# Patient Record
Sex: Male | Born: 1980 | Race: Black or African American | Hispanic: No | Smoking: Former smoker
Health system: Southern US, Community
[De-identification: ages and names within clinical notes are randomized; demographics above are authoritative.]

---

## 2004-03-11 ENCOUNTER — Emergency Department (HOSPITAL_COMMUNITY): Admission: EM | Admit: 2004-03-11 | Discharge: 2004-03-11 | Payer: Self-pay | Admitting: Emergency Medicine

## 2004-03-12 ENCOUNTER — Emergency Department (HOSPITAL_COMMUNITY): Admission: EM | Admit: 2004-03-12 | Discharge: 2004-03-12 | Payer: Self-pay | Admitting: Emergency Medicine

## 2007-01-11 ENCOUNTER — Emergency Department: Payer: Self-pay | Admitting: Emergency Medicine

## 2008-07-31 ENCOUNTER — Emergency Department (HOSPITAL_COMMUNITY): Admission: EM | Admit: 2008-07-31 | Discharge: 2008-07-31 | Payer: Self-pay | Admitting: Emergency Medicine

## 2012-08-22 ENCOUNTER — Encounter (HOSPITAL_COMMUNITY): Payer: Self-pay | Admitting: Emergency Medicine

## 2012-08-22 ENCOUNTER — Emergency Department (HOSPITAL_COMMUNITY): Payer: BC Managed Care – PPO

## 2012-08-22 ENCOUNTER — Emergency Department (HOSPITAL_COMMUNITY)
Admission: EM | Admit: 2012-08-22 | Discharge: 2012-08-22 | Disposition: A | Discharge status: Home / Self Care | Payer: BC Managed Care – PPO | Attending: Emergency Medicine | Admitting: Emergency Medicine

## 2012-08-22 DIAGNOSIS — IMO0002 Reserved for concepts with insufficient information to code with codable children: Secondary | ICD-10-CM | POA: Insufficient documentation

## 2012-08-22 DIAGNOSIS — T1490XA Injury, unspecified, initial encounter: Secondary | ICD-10-CM

## 2012-08-22 DIAGNOSIS — Z79899 Other long term (current) drug therapy: Secondary | ICD-10-CM | POA: Insufficient documentation

## 2012-08-22 DIAGNOSIS — F17200 Nicotine dependence, unspecified, uncomplicated: Secondary | ICD-10-CM | POA: Insufficient documentation

## 2012-08-22 DIAGNOSIS — S6990XA Unspecified injury of unspecified wrist, hand and finger(s), initial encounter: Secondary | ICD-10-CM | POA: Insufficient documentation

## 2012-08-22 DIAGNOSIS — R209 Unspecified disturbances of skin sensation: Secondary | ICD-10-CM | POA: Insufficient documentation

## 2012-08-22 DIAGNOSIS — S59919A Unspecified injury of unspecified forearm, initial encounter: Secondary | ICD-10-CM | POA: Insufficient documentation

## 2012-08-22 DIAGNOSIS — S59909A Unspecified injury of unspecified elbow, initial encounter: Secondary | ICD-10-CM | POA: Insufficient documentation

## 2012-08-22 DIAGNOSIS — M79641 Pain in right hand: Secondary | ICD-10-CM

## 2012-08-22 DIAGNOSIS — Y9389 Activity, other specified: Secondary | ICD-10-CM | POA: Insufficient documentation

## 2012-08-22 DIAGNOSIS — Y9289 Other specified places as the place of occurrence of the external cause: Secondary | ICD-10-CM | POA: Insufficient documentation

## 2012-08-22 DIAGNOSIS — M25531 Pain in right wrist: Secondary | ICD-10-CM

## 2012-08-22 DIAGNOSIS — Y990 Civilian activity done for income or pay: Secondary | ICD-10-CM | POA: Insufficient documentation

## 2012-08-22 MED ORDER — IBUPROFEN 800 MG PO TABS: 800.0000 mg | ORAL_TABLET | Freq: Three times a day (TID) | ORAL | Status: DC

## 2012-08-22 NOTE — Progress Notes (Signed)
Orthopedic Tech Progress Note Patient Details:  Shane Lopez 05/08/1981 [id number]  Ortho Devices Type of Ortho Device: Velcro wrist forearm splint Ortho Device/Splint Interventions: Application   Cammer, Mickie Bail 08/22/2012, 10:38 AM

## 2012-08-22 NOTE — ED Provider Notes (Signed)
History     CSN: [id number]  Arrival date & time 08/22/12  3086   First MD Initiated Contact with Patient 08/22/12 [phone]      Chief Complaint  Patient presents with  . Wrist Pain  . Hand Pain    (Consider location/radiation/quality/duration/timing/severity/associated sxs/prior treatment) HPI Comments: Patient is a 32 y/o M c/o right hand and wrist pain x 1 day. Patient reported that while throwing cardboard boxes into the compactor he hit his right hand on a steel bar. Patient reported pain to the right hand with radiation to right lateral aspect of forearm, does not radiate to the elbow - pain described as a dull constant pain that has gotten progressively worse since this morning, with intermittent episodes of pulsating discomfort to right hand and digits. Patient reported mild numbness and tingling to right hand and digits. Patient reported taking Ibuprofen last night and this morning with mild relief, pain worsens with motion and making a fist. Denied fever, chills, chest pain, shortness of breathe, difficulty breathing, visual disturbances, headache, head trauma.   Patient reported previous injury to right hand back in 2002 while playing football.   Patient is a 32 y.o. male presenting with wrist pain and hand pain. The history is provided by the patient. No language interpreter was used.  Wrist Pain This is a new problem. The current episode started yesterday. The problem occurs constantly. The problem has been gradually worsening. Pertinent negatives include no abdominal pain, chest pain, fever, headaches or neck pain. Exacerbated by: Motion to rigth hand and wrist. He has tried NSAIDs for the symptoms. The treatment provided no relief.  Hand Pain This is a new problem. The current episode started yesterday. The problem occurs constantly. The problem has been gradually worsening. Pertinent negatives include no abdominal pain, chest pain, fever, headaches or neck pain. Associated  symptoms comments: Mild parethesias to right digits. Exacerbated by: Motion to right hand and wrist. He has tried NSAIDs for the symptoms. The treatment provided no relief.    History reviewed. No pertinent past medical history.  History reviewed. No pertinent past surgical history.  No family history on file.  History  Substance Use Topics  . Smoking status: Current Every Day Smoker  . Smokeless tobacco: Not on file  . Alcohol Use: No      Review of Systems  Constitutional: Negative for fever.  HENT: Negative for neck pain.   Respiratory: Negative for chest tightness and shortness of breath.   Cardiovascular: Negative for chest pain.  Gastrointestinal: Negative for abdominal pain.  Musculoskeletal:       Right hand and wrist pain.   Neurological: Negative for dizziness and headaches.  10 Systems reviewed and are negative for acute change except as noted in the HPI.   Allergies  Review of patient's allergies indicates no known allergies.  Home Medications   Current Outpatient Rx  Name  Route  Sig  Dispense  Refill  . Multiple Vitamin (MULTIVITAMIN WITH MINERALS) TABS   Oral   Take 1 tablet by mouth daily.         . vitamin C (ASCORBIC ACID) 500 MG tablet   Oral   Take 500 mg by mouth daily.         Marland Kitchen ibuprofen (ADVIL,MOTRIN) 800 MG tablet   Oral   Take 1 tablet (800 mg total) by mouth 3 (three) times daily.   21 tablet   0     BP 138/72  Pulse 80  Temp(Src) 97.2  F (36.2 C) (Oral)  Resp 16  SpO2 100%  Physical Exam  Nursing note and vitals reviewed. Constitutional: He appears well-developed and well-nourished. No distress.  HENT:  Head: Normocephalic and atraumatic.  Nose: Nose normal.  Mouth/Throat: Oropharynx is clear and moist. No oropharyngeal exudate.  Eyes: Conjunctivae and EOM are normal. Pupils are equal, round, and reactive to light. Right eye exhibits no discharge. Left eye exhibits no discharge.  Neck: Normal range of motion. Neck  supple.  Cardiovascular: Normal rate, regular rhythm, normal heart sounds and intact distal pulses.   Pulmonary/Chest: Effort normal and breath sounds normal. No respiratory distress. He has no wheezes. He has no rales.  Musculoskeletal: Normal range of motion. He exhibits tenderness. He exhibits no edema.  Right upper extremity: Mild swelling to right hand. No lesions, sores, masses, inflammation, erythema, ecchymosis noted. Tenderness upon palpation to right hand and wrist. Full ROM to right digits. Full ROM to right wrist with mild pain radiating up the right forearm. Full ROM to elbow and shoulder. Strength 4+/5+ to right wrist. Sensation intact. Two point discrimination intact. Radial pulses 2+.  Lymphadenopathy:    He has no cervical adenopathy.  Neurological: He is alert.  Skin: Skin is warm and dry. No rash noted. He is not diaphoretic. No erythema.  Psychiatric: He has a normal mood and affect. His behavior is normal.    ED Course  Procedures (including critical care time)  Labs Reviewed - No data to display Dg Wrist Complete Right  08/22/2012  *RADIOLOGY REPORT*  Clinical Data: Right hand pain  RIGHT WRIST - COMPLETE 3+ VIEW  Comparison: None.  Findings: Four views of the right wrist submitted.  No acute fracture or subluxation.  No radiopaque foreign body.  IMPRESSION: No acute fracture or subluxation.   Original Report Authenticated By: Natasha Mead, M.D.    Dg Hand Complete Right  08/22/2012  *RADIOLOGY REPORT*  Clinical Data: Fifth finger pain  RIGHT HAND - COMPLETE 3+ VIEW  Comparison: None.  Findings: Three views of the right hand submitted.  No acute fracture or subluxation.  Old fracture of the right fifth metacarpal.  IMPRESSION: No acute fracture or subluxation.   Original Report Authenticated By: Natasha Mead, M.D.      1. Right hand pain   2. Right wrist pain   3. Injury       MDM  Patient is a 32 y/o M c/o right hand and wrist pain after hitting hand against steel bar  at work. Patient reported mild swelling to right hand with constant dull pain that radiates up lateral aspect of forearm, intermittent throbbing pain - increased pain upon motion. Denied fever, chills, head trauma, visual distortions, chest pain, shortness of breath, difficulty breathing.   I personally evaluated and examined the patient. PE: Alert and oriented. Right upper extremity: mild swelling to right hand. Pain upon palpation to right hand and wrist. Strength 4+/5+. Radial pulse 2+. Full ROM to right digits and wrist with increased pain. No neurovascular damaged noted.   Ordered right hand and wrist x-ray.  Right hand x-ray: Old fracture of the right fifth metacarpal noted - no acute fracture or subluxation Right wrist x-ray: no acute fracture or subluxation. Wrist splint, velcro, placed on patient.   Patient is afebrile, normotensive, non-tachycardic, alert, happy affect. Patient is not septic, no sign of septic joint. No fractures found - most likely musculoskeletal. Discharged patient - discussed with patient to follow-up with orthopedics regarding injury. Discussed with patient to  continue wearing brace at all times, keep arm elevated at night propped with pillows, and ice as often as possible. Discussed with patient to decrease motion as much as possible. Discharged patient with Ibuprofen. Note given for work for patient. Discussed with patient that if symptoms are to worsen report back to the ED. Patient agreed to plan of care, understood, all questions answered.     Raymon Mutton, PA-C 08/22/12 1652

## 2012-08-22 NOTE — ED Notes (Signed)
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 Full sensation radial pulse +2

## 2012-08-22 NOTE — ED Provider Notes (Signed)
Medical screening examination/treatment/procedure(s) were performed by non-physician practitioner and as supervising physician I was immediately available for consultation/collaboration.   Carleene Cooper III, MD 08/22/12 2111

## 2012-12-05 ENCOUNTER — Emergency Department (HOSPITAL_COMMUNITY)
Admission: EM | Admit: 2012-12-05 | Discharge: 2012-12-05 | Disposition: A | Discharge status: Home / Self Care | Payer: BC Managed Care – PPO | Source: Home / Self Care

## 2012-12-05 DIAGNOSIS — S335XXA Sprain of ligaments of lumbar spine, initial encounter: Secondary | ICD-10-CM

## 2012-12-05 DIAGNOSIS — S39012A Strain of muscle, fascia and tendon of lower back, initial encounter: Secondary | ICD-10-CM

## 2012-12-05 MED ORDER — KETOROLAC TROMETHAMINE 30 MG/ML IJ SOLN
30.0000 mg | Freq: Once | INTRAMUSCULAR | Status: AC
  Administered 2012-12-05: 30 mg via INTRAMUSCULAR

## 2012-12-05 MED ORDER — KETOROLAC TROMETHAMINE 10 MG PO TABS: 10.0000 mg | ORAL_TABLET | Freq: Four times a day (QID) | ORAL | Status: DC | PRN

## 2012-12-05 MED ORDER — KETOROLAC TROMETHAMINE 30 MG/ML IJ SOLN
INTRAMUSCULAR | Status: AC
  Filled 2012-12-05: qty 1

## 2012-12-05 MED ORDER — CYCLOBENZAPRINE HCL 5 MG PO TABS: 5.0000 mg | ORAL_TABLET | Freq: Three times a day (TID) | ORAL | Status: DC | PRN

## 2012-12-05 NOTE — ED Provider Notes (Signed)
   History    CSN: [id number] Arrival date & time 12/05/12  1847  None    Chief Complaint  Patient presents with  . Back Pain   (Consider location/radiation/quality/duration/timing/severity/associated sxs/prior Treatment) Patient is a 32 y.o. male presenting with back pain. The history is provided by the patient.  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 Relieved by:  Nothing Worsened by:  Nothing tried Associated symptoms: no abdominal pain, no bladder incontinence, no bowel incontinence, no fever, no leg pain, no numbness, no paresthesias, no pelvic pain, no perianal numbness, no tingling and no weakness    No past medical history on file. No past surgical history on file. No family history on file. History  Substance Use Topics  . Smoking status: Current Every Day Smoker  . Smokeless tobacco: Not on file  . Alcohol Use: No    Review of Systems  Constitutional: Negative.  Negative for fever.  Gastrointestinal: Negative.  Negative for abdominal pain and bowel incontinence.  Genitourinary: Negative.  Negative for bladder incontinence and pelvic pain.  Musculoskeletal: Positive for back pain. Negative for myalgias, joint swelling and gait problem.  Skin: Negative.   Neurological: Negative for tingling, weakness, numbness and paresthesias.    Allergies  Review of patient's allergies indicates no known allergies.  Home Medications   Current Outpatient Rx  Name  Route  Sig  Dispense  Refill  . cyclobenzaprine (FLEXERIL) 5 MG tablet   Oral   Take 1 tablet (5 mg total) by mouth 3 (three) times daily as needed for muscle spasms.   30 tablet   0   . ibuprofen (ADVIL,MOTRIN) 800 MG  tablet   Oral   Take 1 tablet (800 mg total) by mouth 3 (three) times daily.   21 tablet   0   . ketorolac (TORADOL) 10 MG tablet   Oral   Take 1 tablet (10 mg total) by mouth every 6 (six) hours as needed for pain.   20 tablet   0   . Multiple Vitamin (MULTIVITAMIN WITH MINERALS) TABS   Oral   Take 1 tablet by mouth daily.         . vitamin C (ASCORBIC ACID) 500 MG tablet   Oral   Take 500 mg by mouth daily.          BP 120/62  Pulse 72  Temp(Src) 98.4 F (36.9 C) (Oral)  Resp 16  SpO2 99% Physical Exam  Nursing note and vitals reviewed. Constitutional: He is oriented to person, place, and time. He appears well-developed and well-nourished.  Abdominal: Soft. Bowel sounds are normal.  Musculoskeletal: He exhibits tenderness.       Lumbar back: He exhibits decreased range of motion, tenderness, pain and spasm. He exhibits no edema, no deformity and normal pulse.       Back:  Neurological: He is alert and oriented to person, place, and time.  Skin: Skin is warm and dry.    ED Course  Procedures (including critical care time) Labs Reviewed - No data to display No results found. 1. Repetitive strain injury of lower back, initial encounter     MDM    Linna Hoff, MD 12/05/12 2045

## 2012-12-05 NOTE — ED Notes (Signed)
Patient states he is here for back pain.  Works at [employer] and thinks back pain is coming from lifting heavy items constantly. No remedies done.  excerdin and tylenol was taken but no relief.

## 2013-02-09 ENCOUNTER — Emergency Department (HOSPITAL_COMMUNITY)
Admission: EM | Admit: 2013-02-09 | Discharge: 2013-02-09 | Disposition: A | Discharge status: Home / Self Care | Payer: BC Managed Care – PPO | Attending: Emergency Medicine | Admitting: Emergency Medicine

## 2013-02-09 ENCOUNTER — Encounter (HOSPITAL_COMMUNITY): Payer: Self-pay | Admitting: *Deleted

## 2013-02-09 DIAGNOSIS — Y92410 Unspecified street and highway as the place of occurrence of the external cause: Secondary | ICD-10-CM | POA: Insufficient documentation

## 2013-02-09 DIAGNOSIS — F17200 Nicotine dependence, unspecified, uncomplicated: Secondary | ICD-10-CM | POA: Insufficient documentation

## 2013-02-09 DIAGNOSIS — S161XXA Strain of muscle, fascia and tendon at neck level, initial encounter: Secondary | ICD-10-CM

## 2013-02-09 DIAGNOSIS — S139XXA Sprain of joints and ligaments of unspecified parts of neck, initial encounter: Secondary | ICD-10-CM | POA: Insufficient documentation

## 2013-02-09 DIAGNOSIS — Y9389 Activity, other specified: Secondary | ICD-10-CM | POA: Insufficient documentation

## 2013-02-09 MED ORDER — KETOROLAC TROMETHAMINE 60 MG/2ML IM SOLN
60.0000 mg | Freq: Once | INTRAMUSCULAR | Status: AC
  Administered 2013-02-09: 60 mg via INTRAMUSCULAR
  Filled 2013-02-09: qty 2

## 2013-02-09 MED ORDER — HYDROCODONE-ACETAMINOPHEN 5-325 MG PO TABS: 2.0000 | ORAL_TABLET | Freq: Four times a day (QID) | ORAL | Status: DC | PRN

## 2013-02-09 MED ORDER — METHOCARBAMOL 500 MG PO TABS: 500.0000 mg | ORAL_TABLET | Freq: Two times a day (BID) | ORAL | Status: DC

## 2013-02-09 MED ORDER — IBUPROFEN 800 MG PO TABS: 800.0000 mg | ORAL_TABLET | Freq: Three times a day (TID) | ORAL | Status: DC
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
## 2013-02-10 NOTE — ED Provider Notes (Signed)
Medical screening examination/treatment/procedure(s) were performed by non-physician practitioner and as supervising physician I was immediately available for consultation/collaboration.  Flint Melter, MD 02/10/13 [phone]

## 2013-07-04 ENCOUNTER — Encounter (HOSPITAL_COMMUNITY): Payer: Self-pay | Admitting: Emergency Medicine

## 2013-07-04 ENCOUNTER — Emergency Department (HOSPITAL_COMMUNITY)
Admission: EM | Admit: 2013-07-04 | Discharge: 2013-07-04 | Disposition: A | Discharge status: Home / Self Care | Payer: Self-pay | Attending: Emergency Medicine | Admitting: Emergency Medicine

## 2013-07-04 DIAGNOSIS — Z87891 Personal history of nicotine dependence: Secondary | ICD-10-CM | POA: Insufficient documentation

## 2013-07-04 DIAGNOSIS — B354 Tinea corporis: Secondary | ICD-10-CM | POA: Insufficient documentation

## 2013-07-04 MED ORDER — CLOTRIMAZOLE-BETAMETHASONE 1-0.05 % EX CREA: TOPICAL_CREAM | CUTANEOUS | Status: DC

## 2013-07-04 NOTE — Discharge Instructions (Signed)
Body Ringworm °Ringworm (tinea corporis) is a fungal infection of the skin on the body. This infection is not caused by worms, but is actually caused by a fungus. Fungus normally lives on the top of your skin and can be useful. However, in the case of ringworms, the fungus grows out of control and causes a skin infection. It can involve any area of skin on the body and can spread easily from one person to another (contagious). Ringworm is a common problem for children, but it can affect adults as well. Ringworm is also often found in athletes, especially wrestlers who share equipment and mats.  °CAUSES  °Ringworm of the body is caused by a fungus called dermatophyte. It can spread by: °· Touching other people who are infected. °· Touching infected pets. °· Touching or sharing objects that have been in contact with the infected person or pet (hats, combs, towels, clothing, sports equipment). °SYMPTOMS  °· Itchy, raised red spots and bumps on the skin. °· Ring-shaped rash. °· Redness near the border of the rash with a clear center. °· Dry and scaly skin on or around the rash. °Not every person develops a ring-shaped rash. Some develop only the red, scaly patches. °DIAGNOSIS  °Most often, ringworm can be diagnosed by performing a skin exam. Your caregiver may choose to take a skin scraping from the affected area. The sample will be examined under the microscope to see if the fungus is present.  °TREATMENT  °Body ringworm may be treated with a topical antifungal cream or ointment. Sometimes, an antifungal shampoo that can be used on your body is prescribed. You may be prescribed antifungal medicines to take by mouth if your ringworm is severe, keeps coming back, or lasts a long time.  °HOME CARE INSTRUCTIONS  °· Only take over-the-counter or prescription medicines as directed by your caregiver. °· Wash the infected area and dry it completely before applying your cream or ointment. °· When using antifungal shampoo to  treat the ringworm, leave the shampoo on the body for 3 5 minutes before rinsing.    °· Wear loose clothing to stop clothes from rubbing and irritating the rash. °· Wash or change your bed sheets every night while you have the rash. °· Have your pet treated by your veterinarian if it has the same infection. °To prevent ringworm:  °· Practice good hygiene. °· Wear sandals or shoes in public places and showers. °· Do not share personal items with others. °· Avoid touching red patches of skin on other people. °· Avoid touching pets that have bald spots or wash your hands after doing so. °SEEK MEDICAL CARE IF:  °· Your rash continues to spread after 7 days of treatment. °· Your rash is not gone in 4 weeks. °· The area around your rash becomes red, warm, tender, and swollen. °Document Released: 05/13/2000 Document Revised: 02/08/2012 Document Reviewed: 11/28/2011 °[employer]® Patient Information ©2014 [employer]. ° °

## 2013-07-04 NOTE — ED Provider Notes (Signed)
CSN: [id number]     Arrival date & time 07/04/13  2112 History   First MD Initiated Contact with Patient 07/04/13 2307     Chief Complaint  Patient presents with  . Rash   (Consider location/radiation/quality/duration/timing/severity/associated sxs/prior Treatment) HPI This patient is a generally healthy 33 year old man who complains of an itchy rash over his torso or extremities. He noticed the rash one week ago. He first noticed it in the lower abdominal/inguinal region. He denies history of same. No other symptoms. No pain. Nothing makes symptoms worse or better.  History reviewed. No pertinent past medical history. History reviewed. No pertinent past surgical history. No family history on file. History  Substance Use Topics  . Smoking status: Former Smoker    Quit date: 07/04/2012  . Smokeless tobacco: Not on file  . Alcohol Use: No    Review of Systems Ten point review of symptoms performed and is negative with the exception of symptoms noted above.   Allergies  Review of patient's allergies indicates no known allergies.  Home Medications  No current outpatient prescriptions on file. BP 122/83  Pulse 77  Temp(Src) 98 F (36.7 C) (Oral)  Resp 20  SpO2 97% Physical Exam Gen: well developed and well nourished appearing Head: NCAT Eyes: PERL, EOMI Nose: no epistaixis or rhinorrhea Mouth/throat: mucosa is moist and pink Neck: supple, no stridor Lungs: CTA B, no wheezing, rhonchi or rales CV: RRR, no murmur, extremities appear well perfused.  Abd: soft, notender, nondistended Back: no ttp, no cva ttp Skin: Multiple, well demarcated, slightly raised outer rim in ringlike pattern over the torso and all extremities with some overlaying excoriations. No ulcerations. No pustules.  Ext: normal to inspection, no dependent edema Neuro: CN ii-xii grossly intact, no focal deficits Psyche; normal affect,  calm and cooperative.   ED Course  Procedures (including critical care  time)  MDM  Patient with tinea corporis. We will treat with clotrimazole topical cream. I've informed the patient his diagnosis and need for outpatient followup.   Brandt LoosenJulie Dakin Madani, MD 07/04/13 [phone]

## 2013-07-04 NOTE — ED Notes (Signed)
Pt has circular, itchy rash to left chest/axilla area that trails to arm and back x 1.5 weeks.  Reports he placed bleach on it, stopped the itch but it has continued to spread.  Says no one else in the house has it.

## 2013-07-04 NOTE — ED Notes (Signed)
Pt states for one week he has been having a systemic body rash.  Rashes noted to pt's arms, chest and back.  Some in small circles

## 2013-11-27 ENCOUNTER — Encounter (HOSPITAL_COMMUNITY): Payer: Self-pay | Admitting: Emergency Medicine

## 2013-11-27 ENCOUNTER — Emergency Department (HOSPITAL_COMMUNITY): Payer: Self-pay

## 2013-11-27 ENCOUNTER — Emergency Department (HOSPITAL_COMMUNITY)
Admission: EM | Admit: 2013-11-27 | Discharge: 2013-11-27 | Disposition: A | Discharge status: Home / Self Care | Payer: No Typology Code available for payment source | Attending: Emergency Medicine | Admitting: Emergency Medicine

## 2013-11-27 DIAGNOSIS — Z87891 Personal history of nicotine dependence: Secondary | ICD-10-CM | POA: Insufficient documentation

## 2013-11-27 DIAGNOSIS — M25579 Pain in unspecified ankle and joints of unspecified foot: Secondary | ICD-10-CM | POA: Insufficient documentation

## 2013-11-27 DIAGNOSIS — Z79899 Other long term (current) drug therapy: Secondary | ICD-10-CM | POA: Insufficient documentation

## 2013-11-27 DIAGNOSIS — M25571 Pain in right ankle and joints of right foot: Secondary | ICD-10-CM

## 2013-11-27 MED ORDER — TRAMADOL HCL 50 MG PO TABS: 50.0000 mg | ORAL_TABLET | Freq: Four times a day (QID) | ORAL | Status: DC | PRN

## 2013-11-27 MED ORDER — IBUPROFEN 800 MG PO TABS: 800.0000 mg | ORAL_TABLET | Freq: Three times a day (TID) | ORAL | Status: DC

## 2013-11-27 NOTE — Discharge Instructions (Signed)
Call for a follow up appointment with a Family or Primary Care Provider.  Return if Symptoms worsen.   Take medication as prescribed.  Ice your foot 3-4 times a day, elevate the foot above your heart while you are not standing or walking.   Emergency Department Resource Guide 1) Find a Doctor and Pay Out of Pocket Although you won't have to find out who is covered by your insurance plan, it is a good idea to ask around and get recommendations. You will then need to call the office and see if the doctor you have chosen will accept you as a new patient and what types of options they offer for patients who are self-pay. Some doctors offer discounts or will set up payment plans for their patients who do not have insurance, but you will need to ask so you aren't surprised when you get to your appointment.  2) Contact Your Local Health Department Not all health departments have doctors that can see patients for sick visits, but many do, so it is worth a call to see if yours does. If you don't know where your local health department is, you can check in your phone book. The CDC also has a tool to help you locate your state's health department, and many state websites also have listings of all of their local health departments.  3) Find a Walk-in Clinic If your illness is not likely to be very severe or complicated, you may want to try a walk in clinic. These are popping up all over the country in pharmacies, drugstores, and shopping centers. They're usually staffed by nurse practitioners or physician assistants that have been trained to treat common illnesses and complaints. They're usually fairly quick and inexpensive. However, if you have serious medical issues or chronic medical problems, these are probably not your best option.  No Primary Care Doctor: - Call Health Connect at  [phone] - they can help you locate a primary care doctor that  accepts your insurance, provides certain services,  etc. - Physician Referral Service- [phone]  Chronic Pain Problems: Organization         Address  Phone   Notes  [facility]  [phone] Patients need to be referred by their primary care doctor.   Medication Assistance: Organization         Address  Phone   Notes  [facility] [address] [place], [place] [address] [phone] --Must be a resident of [place] -- Must have NO insurance coverage whatsoever (no Medicaid/ Medicare, etc.) -- The pt. MUST have a primary care doctor that directs their care regularly and follows them in the community   MedAssist  [phone]   [employer]  [phone]    Agencies that provide inexpensive medical care: Organization         Address  Phone   Notes  [facility]  [phone]   [facility]    [phone]   [facility] [address] [place], [place] [address] [phone]   [facility] [address], [place] [phone]   Planned Parenthood    [phone]) [phone]   [facility]    [phone]   [facility]  [address], [place] Phone:  [phone], Fax:  [phone] Hours of Operation:  9 am - 6 pm, M-F.  Also accepts Medicaid/Medicare and self-pay.  [facility] for [address] [place], [address], [place] Phone: [phone], Fax: [phone]. Hours of Operation:  8:30 am - 5:30 pm, M-F.  Also accepts Medicaid and self-pay.  [facility] [address] [place] Phone: [phone]   [place], [place], [place] [phone], Ext. [phone] Mondays & Thursdays: 7-9 AM.  First 15 patients are seen on a first come, first serve basis.    [place] Providers:  Organization         Address  Phone   Notes  [facility] [address],  [phone] Also accepts self-pay patients.  [facility] [address], [place]  [phone]   [place], [address], [place] [phone]   [facility] [address] [place] [phone]   Lucianne Lei [address], [place]   [phone] Only accepts [place] Access [place] patients after they have their name applied to their card.   Self-Pay (no insurance) in [facility]:  Organization         Address  Phone   Notes  Sickle Cell Patients, [facility] [place] [phone]   [facility] [place] [phone]   [facility] [place]  [place], [facility], [place] [phone]   [facility]/Dr. Osei-Bonsu  [address], [place] or [address], [place] [phone] Phone number for both [place] and [place] locations is the same.  Urgent Medical and [facility] [address] [place] [phone]   [facility] [address] [place] or [address] [phone]   [facility] [address], [place] [phone], phone; [phone], fax Sees patients 1st and 3rd Saturday of every month.  Must not qualify for public or private insurance (i.e. Medicaid, Medicare, Becker Health Choice, Veterans' Benefits)  Household income should be no more than 200% of the poverty level The clinic cannot treat you if you are pregnant or think you are pregnant  Sexually transmitted diseases are not treated at the clinic.    Dental Care: Organization         Address  Phone  Notes  [facility] Department of [facility] [phone] Accepts children up to  age 51 who are enrolled in [place] or [place]; pregnant women with a Medicaid card; and children who have applied for Medicaid or [place] Health Choice, but were declined, whose parents can pay a reduced fee at time of service.  [facility] Department of [facility]  [address], [place] [phone] Accepts children up to age 24 who are enrolled in [place] or [place]; pregnant women with a Medicaid card; and children who have applied for Medicaid or [place] Health Choice, but were declined, whose parents can pay a reduced fee at time of service.  [facility] Access PRO[facility]  [place] [phone] Patients are seen by appointment only. Walk-ins are not accepted. [place] will see patients 54 years of age and older. Monday - Tuesday (  8am-5pm) Most Wednesdays (8:30-5pm) $30 per visit, cash only  [facility] Adult Dental Access PROGRAM  [address], [facility] [phone] Patients are seen by appointment only. Walk-ins are not accepted. [place] will see patients 2 years of age and older. One Wednesday Evening (Monthly: Volunteer Based).  $30 per visit, cash only  [place]  [phone] for adults; Children under age 70, call Graduate Pediatric Dentistry at [phone]. Children aged 26-14, please call [phone] to request a pediatric application.  Dental services are provided in all areas of dental care including fillings, crowns and bridges, complete and partial dentures, implants, gum treatment, root canals, and extractions. Preventive care is also provided. Treatment is provided to both adults and children. Patients are selected via a lottery and there is often a waiting list.   [facility] [address], [place]  [phone] [email or web address]   [facility] [address] [place], [place] [phone], Ext. [phone] Second and Fourth Thursday of  each month, opens at 6:30 AM; Clinic ends at 9 AM.  Patients are seen on a first-come first-served basis, and a limited number are seen during each clinic.   [facility]  [address] Hillard Danker [place], [place] [phone]   Eligibility Requirements You must have lived in [place], [place], or [place] counties for at least the last three months.   You cannot be eligible for state or federal sponsored [employer], including [employer], [place], or [employer].   You generally cannot be eligible for healthcare insurance through your employer.    How to apply: Eligibility screenings are held every Tuesday and Wednesday afternoon from 1:00 pm until 4:00 pm. You do not need an appointment for the interview!  [facility] [address], [place], [place]   [place]  Grants Department  [place]  [phone]    Behavioral Health Resources in the Community: Intensive Outpatient Programs Organization         Address  Phone  Notes  [place]. [address] [place], [place] [phone]   [facility] Outpatient [address], [place], [place]   ADS: Alcohol & Drug Svcs [address] [place], [place]   [place] [address],  [place], [place] or [phone]   Substance Abuse Resources Organization         Address  Phone  Notes  Al[facility] Drug Services  [phone]   [place]  [phone]   The [place]   Chinita Pester  [phone]   Residential & Outpatient Substance Abuse Program  [phone]   Psychological Services Organization         Address  Phone  Notes  [facility] [place]  [place]  [phone]   [place] [address]  [place] or [phone]    Mobile Crisis Teams Organization         Address  Phone  Notes  [facility], Mobile Crisis Care Unit  [phone]   [facility]  [address] [place], [place]   [place] [address], [place] [phone]    Self-Help/Support Groups Organization         Address  Phone  Notes  Mental Health Assoc. of [place] - variety of support groups  [place] Call for more information  Narcotics Anonymous (NA), [facility] [address], [employer] [place]  2 meetings at this location   Special educational needs teacher         Address  Phone  Notes  ASAP Residential Treatment [place],    [place]  [phone]   [facility]  [address], [place] [address], [place], [place]   [place], [place] [phone] Admissions: 8am-3pm M-F  Incentives Substance [place] [address]    [place], [place] [phone]   [facility] [address] [place], [place], [place]   [facility] [address],  [place], [place]   [facility] Programs - Intensive Outpatient [address] Krist[address], [place], [place]   [facility] ([employer] [place].,  [place], [place] [phone] or [phone]   [facility] Services (RTS) [address], [place], [place] Accepts Medicaid  [facility] [place] [address],  [place] [phone] Substance Abuse/Addiction Treatment   [facility] Organization         Address  Phone  Notes  [facility]  [phone]   Domenic Schwab, PhD [address] Arlis Porta [place], [place]   [phone] or [phone]   [place], [place] [phone]     [facility] [address], [place], [place] [phone] Insurance/Medicaid/sponsorship through [facility] and Families [address], [address] [place]                                    [place], [place] [phone] [place] [address], [place] [phone]    Dr. Adele Schilder  [phone]   [facility] Dept. 1) [address] [place] 2) [place] 3)  [place] [address], [place] [phone]  [phone]   [place] [phone] or [phone] (After Hours)

## 2013-11-27 NOTE — ED Provider Notes (Signed)
CSN: [id number]     Arrival date & time 11/27/13  [id number] History  This chart was scribed for non-physician practitioner, Mellody DrownLauren Kierria Feigenbaum, PA-C working with Flint MelterElliott L Wentz, MD by Greggory StallionKayla Andersen, ED scribe. This patient was seen in room TR09C/TR09C and the patient's care was started at 10:50 AM.   Chief Complaint  Patient presents with  . Ankle Pain   HPI Comments: Shane Lopez is a 33 y.o. male who presents to the Emergency Department complaining of gradual onset right ankle pain that started last night. 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 Pt has taken 600 mg ibuprofen with some relief. Twisting his ankle around worsens the pain. Denies fever, chills. Denies prior right ankle injury. Pt does not have a PCP.   The history is provided by the patient. No language interpreter was used.    History reviewed. No pertinent past medical history. History reviewed. No pertinent past surgical history. History reviewed. No pertinent family history. History  Substance Use Topics  . Smoking status: Former Smoker    Quit date: 07/04/2012  . Smokeless tobacco: Not on file  . Alcohol Use: Yes    Review of Systems  Constitutional: Negative for fever and chills.  Musculoskeletal: Positive for arthralgias. Negative for gait problem and joint swelling.  Skin: Negative for color change and wound.  Neurological: Negative for weakness and numbness.  All other systems reviewed and are negative.  Allergies  Review of patient's allergies indicates no known allergies.  Home Medications   Prior to Admission medications   Medication Sig Start Date End Date Taking? Authorizing Provider  clotrimazole-betamethasone (LOTRISONE) cream Apply to affected area 2 times daily for 7 days and then as needed. 07/04/13   Brandt LoosenJulie Manly, MD   BP 115/65  Pulse 76  Temp(Src) 97.8 F (36.6 C) (Oral)  Resp 20  Ht 6\' 2"  (1.88 m)  Wt 217 lb (98.431 kg)  BMI 27.85  kg/m2  SpO2 98%  Physical Exam  Nursing note and vitals reviewed. Constitutional: He is oriented to person, place, and time. He appears well-developed and well-nourished.  Non-toxic appearance. He does not have a sickly appearance. He does not appear ill. No distress.  HENT:  Head: Normocephalic and atraumatic.  Eyes: Conjunctivae and EOM are normal.  Neck: Normal range of motion. Neck supple. No tracheal deviation present.  Cardiovascular: Normal rate.   Pulmonary/Chest: Effort normal. No respiratory distress.  Musculoskeletal: Normal range of motion.       Right ankle: He exhibits normal range of motion. No tenderness. No lateral malleolus, no medial malleolus, no AITFL and no head of 5th metatarsal tenderness found. Achilles tendon normal.  No tenderness with palpation, no increase in warmth, no overlying erythema. No wounds. Good cap refill, neuro intact distally.  Neurological: He is alert and oriented to person, place, and time.  Skin: Skin is warm and dry. He is not diaphoretic.  Psychiatric: He has a normal mood and affect. His behavior is normal.    ED Course  Procedures (including critical care time)  COORDINATION OF CARE: 10:52 AM-Discussed treatment plan which includes xray, pain medication and continuing ibuprofen with pt at bedside and pt agreed to plan.   Labs Review Labs Reviewed - No data to display  Imaging Review Dg Ankle Complete Right  11/27/2013   CLINICAL DATA:  Clinical  EXAM: RIGHT ANKLE - COMPLETE 3+ VIEW  COMPARISON:  None.  FINDINGS: Frontal, oblique, and  lateral views were obtained. There is no fracture or effusion. Ankle mortise appears intact. No erosive change.  IMPRESSION: No fracture.  Mortise intact.   Electronically Signed   By: Bretta BangWilliam  Woodruff M.D.   On: 11/27/2013 10:51     EKG Interpretation None      MDM   Final diagnoses:  Right ankle pain   Patient presents with right ankle pain, no history of injury. No concern for infected  joint. X-ray negative. Will treat with anti-inflammatories ice and elevation. Discussed treatment plan with the patient. Return precautions given. Reports understanding and no other concerns at this time.  Patient is stable for discharge at this time.  Meds given in ED:  Medications - No data to display  New Prescriptions   IBUPROFEN (ADVIL,MOTRIN) 800 MG TABLET    Take 1 tablet (800 mg total) by mouth 3 (three) times daily with meals.   TRAMADOL (ULTRAM) 50 MG TABLET    Take 1 tablet (50 mg total) by mouth every 6 (six) hours as needed.    I personally performed the services described in this documentation, which was scribed in my presence. The recorded information has been reviewed and is accurate.  Clabe SealLauren M Amrom Ore, PA-C 11/27/13 1130

## 2013-11-27 NOTE — ED Notes (Signed)
He states hes had R ankle pain since yesterday. He took 600mg  ibuprofen at home with some relief of pain. 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

## 2013-11-28 NOTE — ED Provider Notes (Signed)
Medical screening examination/treatment/procedure(s) were performed by non-physician practitioner and as supervising physician I was immediately available for consultation/collaboration.  Ransome Helwig L Yannely Kintzel, MD 11/28/13 0832 

## 2014-01-16 ENCOUNTER — Emergency Department (HOSPITAL_COMMUNITY)
Admission: EM | Admit: 2014-01-16 | Discharge: 2014-01-16 | Disposition: A | Discharge status: Home / Self Care | Payer: Self-pay | Attending: Emergency Medicine | Admitting: Emergency Medicine

## 2014-01-16 ENCOUNTER — Encounter (HOSPITAL_COMMUNITY): Payer: Self-pay | Admitting: Emergency Medicine

## 2014-01-16 ENCOUNTER — Emergency Department (HOSPITAL_COMMUNITY): Payer: Self-pay

## 2014-01-16 DIAGNOSIS — Z87891 Personal history of nicotine dependence: Secondary | ICD-10-CM | POA: Insufficient documentation

## 2014-01-16 DIAGNOSIS — M62838 Other muscle spasm: Secondary | ICD-10-CM | POA: Insufficient documentation

## 2014-01-16 DIAGNOSIS — Z791 Long term (current) use of non-steroidal anti-inflammatories (NSAID): Secondary | ICD-10-CM | POA: Insufficient documentation

## 2014-01-16 DIAGNOSIS — M25519 Pain in unspecified shoulder: Secondary | ICD-10-CM | POA: Insufficient documentation

## 2014-01-16 MED ORDER — DIAZEPAM 5 MG/ML IJ SOLN
10.0000 mg | Freq: Once | INTRAMUSCULAR | Status: AC
  Administered 2014-01-16: 10 mg via INTRAMUSCULAR
  Filled 2014-01-16: qty 2

## 2014-01-16 MED ORDER — KETOROLAC TROMETHAMINE 60 MG/2ML IM SOLN
60.0000 mg | Freq: Once | INTRAMUSCULAR | Status: AC
  Administered 2014-01-16: 60 mg via INTRAMUSCULAR
  Filled 2014-01-16: qty 2

## 2014-01-16 MED ORDER — METHOCARBAMOL 500 MG PO TABS: 1000.0000 mg | ORAL_TABLET | Freq: Two times a day (BID) | ORAL | Status: DC | PRN
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
## 2014-01-16 NOTE — Discharge Instructions (Signed)
Return to the emergency room with worsening of symptoms or with symptoms that are concerning. RICE: Rest, Ice (three cycles of 20 mins on 20mins off at least twice a day), compression/brace, elevation. Heating pad works well for back pain. Ibuprofen 800mg  (3 tablets 200mg ) three times a day for one week. Follow up with primary care provider if symptoms worsen or are persistent. Do not operate machinery or drive while taking muscle relaxer. Take as needed for pain.

## 2014-01-16 NOTE — ED Notes (Signed)
Pt states he started having left shoulder pain this morning. Went to the gym to try to work the soreness out but didn't help. Unable to lift arm d/t pain

## 2014-01-18 NOTE — ED Provider Notes (Signed)
Medical screening examination/treatment/procedure(s) were performed by non-physician practitioner and as supervising physician I was immediately available for consultation/collaboration.   EKG Interpretation None        Kashmir Leedy, DO 01/18/14 1557 

## 2014-09-10 ENCOUNTER — Encounter (HOSPITAL_COMMUNITY): Payer: Self-pay | Admitting: *Deleted

## 2014-09-10 ENCOUNTER — Emergency Department (HOSPITAL_COMMUNITY)
Admission: EM | Admit: 2014-09-10 | Discharge: 2014-09-10 | Disposition: A | Discharge status: Home / Self Care | Payer: BLUE CROSS/BLUE SHIELD | Attending: Emergency Medicine | Admitting: Emergency Medicine

## 2014-09-10 DIAGNOSIS — R52 Pain, unspecified: Secondary | ICD-10-CM | POA: Insufficient documentation

## 2014-09-10 DIAGNOSIS — R197 Diarrhea, unspecified: Secondary | ICD-10-CM | POA: Diagnosis not present

## 2014-09-10 DIAGNOSIS — R05 Cough: Secondary | ICD-10-CM | POA: Diagnosis not present

## 2014-09-10 DIAGNOSIS — R5383 Other fatigue: Secondary | ICD-10-CM | POA: Insufficient documentation

## 2014-09-10 DIAGNOSIS — R112 Nausea with vomiting, unspecified: Secondary | ICD-10-CM | POA: Insufficient documentation

## 2014-09-10 DIAGNOSIS — R6883 Chills (without fever): Secondary | ICD-10-CM | POA: Diagnosis not present

## 2014-09-10 DIAGNOSIS — R42 Dizziness and giddiness: Secondary | ICD-10-CM | POA: Diagnosis not present

## 2014-09-10 DIAGNOSIS — Z87891 Personal history of nicotine dependence: Secondary | ICD-10-CM | POA: Diagnosis not present

## 2014-09-10 DIAGNOSIS — Z79899 Other long term (current) drug therapy: Secondary | ICD-10-CM | POA: Insufficient documentation

## 2014-09-10 NOTE — ED Provider Notes (Signed)
CSN: [id number]     Arrival date & time 09/10/14  [id number] History   First MD Initiated Contact with Patient 09/10/14 [phone]     Chief Complaint  Patient presents with  . Influenza   Shane Lopez is a 34 y.o. male who is otherwise healthy who presents to the emergency department complaining of nausea, vomiting, body aches, fatigue and diarrhea for the past 3 days. Patient also reports chills. He reports one episode of diarrhea today and he last vomited yesterday. He reports he was able to drink orange juice this morning without vomiting. He also reports a slight cough. He denies getting his flu shot this year. He reports his family is at home sick with a viral-like illness. He hasn't taken anything for treatment today. He is requesting a note for work today. The patient denies abdominal pain, shortness of breath, runny nose, nasal congestion, headache, dizziness, urinary symptoms, hematemesis, hematochezia, or rashes.  (Consider location/radiation/quality/duration/timing/severity/associated sxs/prior Treatment) HPI  History reviewed. No pertinent past medical history. History reviewed. No pertinent past surgical history. History reviewed. No pertinent family history. History  Substance Use Topics  . Smoking status: Former Smoker    Quit date: 07/04/2012  . Smokeless tobacco: Not on file  . Alcohol Use: Yes     Comment: occasional    Review of Systems  Constitutional: Positive for chills and fatigue. Negative for fever and appetite change.  HENT: Negative for congestion, ear pain, postnasal drip, rhinorrhea, sinus pressure, sore throat and trouble swallowing.   Eyes: Negative for pain and visual disturbance.  Respiratory: Positive for cough. Negative for shortness of breath and wheezing.   Cardiovascular: Negative for chest pain and palpitations.  Gastrointestinal: Positive for nausea, vomiting and diarrhea. Negative for abdominal pain and blood in stool.  Genitourinary: Negative for  dysuria, urgency, frequency, hematuria, flank pain and difficulty urinating.  Musculoskeletal: Positive for arthralgias. Negative for back pain, neck pain and neck stiffness.  Skin: Negative for rash.  Neurological: Positive for light-headedness. Negative for dizziness, weakness, numbness and headaches.      Allergies  Review of patient's allergies indicates no known allergies.  Home Medications   Prior to Admission medications   Medication Sig Start Date End Date Taking? Authorizing Provider  Misc Natural Products (NF FORMULAS TESTOSTERONE PO) Take 1 tablet by mouth 2 (two) times daily.   Yes Historical Provider, MD  Multiple Vitamins-Minerals (MULTIVITAMIN PO) Take 1 tablet by mouth daily.   Yes Historical Provider, MD  ibuprofen (ADVIL,MOTRIN) 800 MG tablet Take 1 tablet (800 mg total) by mouth 3 (three) times daily with meals. Patient not taking: Reported on 09/10/2014 11/27/13   Mellody DrownLauren Parker, PA-C  methocarbamol (ROBAXIN) 500 MG tablet Take 2 tablets (1,000 mg total) by mouth 2 (two) times daily as needed for muscle spasms. Patient not taking: Reported on 09/10/2014 01/16/14   Oswaldo ConroyVictoria Creech, PA-C  traMADol (ULTRAM) 50 MG tablet Take 1 tablet (50 mg total) by mouth every 6 (six) hours as needed. Patient not taking: Reported on 09/10/2014 11/27/13   Mellody DrownLauren Parker, PA-C   BP 117/67 mmHg  Pulse 69  Temp(Src) 98 F (36.7 C) (Oral)  Resp 16  Ht 6\' 1"  (1.854 m)  Wt 216 lb (97.977 kg)  BMI 28.50 kg/m2  SpO2 99% Physical Exam  Constitutional: He appears well-developed and well-nourished. No distress.  Nontoxic appearing.  HENT:  Head: Normocephalic and atraumatic.  Right Ear: External ear normal.  Left Ear: External ear normal.  Nose: Nose normal.  Mouth/Throat:  Oropharynx is clear and moist. No oropharyngeal exudate.  Bilateral tympanic membranes are pearly-gray without erythema or loss of landmarks.  Eyes: Conjunctivae are normal. Pupils are equal, round, and reactive to light.  Right eye exhibits no discharge. Left eye exhibits no discharge.  Neck: Normal range of motion. Neck supple. No JVD present. No tracheal deviation present.  Cardiovascular: Normal rate, regular rhythm, normal heart sounds and intact distal pulses.  Exam reveals no gallop and no friction rub.   No murmur heard. Bilateral radial pulses are intact.  Pulmonary/Chest: Effort normal and breath sounds normal. No respiratory distress. He has no wheezes. He has no rales.  Abdominal: Soft. Bowel sounds are normal. He exhibits no distension and no mass. There is no tenderness. There is no rebound and no guarding.  Abdomen is soft and nontender to palpation. Bowel sounds are present.  Musculoskeletal: He exhibits no edema.  Lymphadenopathy:    He has no cervical adenopathy.  Neurological: He is alert. Coordination normal.  Skin: Skin is warm and dry. No rash noted. He is not diaphoretic. No erythema. No pallor.  Psychiatric: He has a normal mood and affect. His behavior is normal.  Nursing note and vitals reviewed.   ED Course  Procedures (including critical care time) Labs Review Labs Reviewed - No data to display  Imaging Review No results found.   EKG Interpretation None      Filed Vitals:   09/10/14 0900 09/10/14 0908 09/10/14 0935 09/10/14 0935  BP: 117/67 117/67    Pulse: 71 69    Temp:  98.3 F (36.8 C) 98 F (36.7 C)   TempSrc:  Oral Oral   Resp:  16    Height:     (1.854 m)  Weight:    216 lb (97.977 kg)  SpO2: 99% 99%       MDM   Meds given in ED:  Medications - No data to display  New Prescriptions   No medications on file    Final diagnoses:  Nausea vomiting and diarrhea   This is a 34 year old male who is otherwise healthy who presents to the emergency department complaining of nausea, vomiting, body aches, fatigue and diarrhea for the past 3 days. The patient denies current nausea. He denies having any abdominal pain. The patient reports he is  tolerating orange juice today without vomiting. He reports one episode of diarrhea today. The patient is afebrile and nontoxic-appearing. His abdomen is soft and nontender to palpation. I offered blood work, fluid bolus work up and medications and the patient refuses. He tells me he does not like to take medications. He tells me he is really just requesting a work note today. We'll discharge with work and strict return precautions. I advised the patient to follow-up with their primary care provider this week. I advised the patient to return to the emergency department with new or worsening symptoms or new concerns. The patient verbalized understanding and agreement with plan.       Orvan Papadakis, PA-C 09/10/14 1610  Richardean Canal, MD 09/10/14 [phone]

## 2014-09-10 NOTE — ED Notes (Signed)
Pt states he has weakness, body aches, n/v/d.  Pt states everyone in his family has had this virus.

## 2014-09-10 NOTE — Discharge Instructions (Signed)

## 2015-06-09 ENCOUNTER — Emergency Department (HOSPITAL_COMMUNITY)
Admission: EM | Admit: 2015-06-09 | Discharge: 2015-06-09 | Disposition: A | Discharge status: Home / Self Care | Payer: BLUE CROSS/BLUE SHIELD | Attending: Emergency Medicine | Admitting: Emergency Medicine

## 2015-06-09 ENCOUNTER — Encounter (HOSPITAL_COMMUNITY): Payer: Self-pay

## 2015-06-09 DIAGNOSIS — Z87891 Personal history of nicotine dependence: Secondary | ICD-10-CM | POA: Diagnosis not present

## 2015-06-09 DIAGNOSIS — R197 Diarrhea, unspecified: Secondary | ICD-10-CM | POA: Insufficient documentation

## 2015-06-09 DIAGNOSIS — R112 Nausea with vomiting, unspecified: Secondary | ICD-10-CM | POA: Insufficient documentation

## 2015-06-09 DIAGNOSIS — R231 Pallor: Secondary | ICD-10-CM | POA: Diagnosis not present

## 2015-06-09 DIAGNOSIS — Z79899 Other long term (current) drug therapy: Secondary | ICD-10-CM | POA: Insufficient documentation

## 2015-06-09 DIAGNOSIS — R1110 Vomiting, unspecified: Secondary | ICD-10-CM | POA: Diagnosis present

## 2015-06-09 LAB — CBC WITH DIFFERENTIAL/PLATELET
BASOS ABS: 0 10*3/uL (ref 0.0–0.1)
BASOS PCT: 0 %
EOS ABS: 0 10*3/uL (ref 0.0–0.7)
Eosinophils Relative: 1 %
HCT: 44.6 % (ref 39.0–52.0)
HEMOGLOBIN: 15.3 g/dL (ref 13.0–17.0)
Lymphocytes Relative: 34 %
Lymphs Abs: 1.7 10*3/uL (ref 0.7–4.0)
MCH: 32.6 pg (ref 26.0–34.0)
MCHC: 34.3 g/dL (ref 30.0–36.0)
MCV: 95.1 fL (ref 78.0–100.0)
MONOS PCT: 9 %
Monocytes Absolute: 0.5 10*3/uL (ref 0.1–1.0)
NEUTROS ABS: 2.8 10*3/uL (ref 1.7–7.7)
NEUTROS PCT: 56 %
Platelets: 189 10*3/uL (ref 150–400)
RBC: 4.69 MIL/uL (ref 4.22–5.81)
RDW: 12.9 % (ref 11.5–15.5)
WBC: 5 10*3/uL (ref 4.0–10.5)

## 2015-06-09 LAB — COMPREHENSIVE METABOLIC PANEL
ALK PHOS: 53 U/L (ref 38–126)
ALT: 28 U/L (ref 17–63)
ANION GAP: 9 (ref 5–15)
AST: 38 U/L (ref 15–41)
Albumin: 3.3 g/dL — ABNORMAL LOW (ref 3.5–5.0)
BILIRUBIN TOTAL: 0.8 mg/dL (ref 0.3–1.2)
BUN: 12 mg/dL (ref 6–20)
CALCIUM: 8.3 mg/dL — AB (ref 8.9–10.3)
CO2: 24 mmol/L (ref 22–32)
Chloride: 108 mmol/L (ref 101–111)
Creatinine, Ser: 1.62 mg/dL — ABNORMAL HIGH (ref 0.61–1.24)
GFR calc non Af Amer: 54 mL/min — ABNORMAL LOW (ref >60)
Glucose, Bld: 104 mg/dL — ABNORMAL HIGH (ref 65–99)
POTASSIUM: 4.3 mmol/L (ref 3.5–5.1)
Sodium: 141 mmol/L (ref 135–145)
TOTAL PROTEIN: 5.6 g/dL — AB (ref 6.5–8.1)

## 2015-06-09 LAB — LIPASE, BLOOD: LIPASE: 22 U/L (ref 11–51)

## 2015-06-09 MED ORDER — SODIUM CHLORIDE 0.9 % IV BOLUS (SEPSIS)
1000.0000 mL | Freq: Once | INTRAVENOUS | Status: AC
  Administered 2015-06-09: 1000 mL via INTRAVENOUS

## 2015-06-09 MED ORDER — ONDANSETRON 4 MG PO TBDP: 4.0000 mg | ORAL_TABLET | Freq: Three times a day (TID) | ORAL | Status: DC | PRN

## 2015-06-09 MED ORDER — DICYCLOMINE HCL 10 MG PO CAPS
10.0000 mg | ORAL_CAPSULE | Freq: Once | ORAL | Status: AC
  Administered 2015-06-09: 10 mg via ORAL
  Filled 2015-06-09: qty 1

## 2015-06-09 MED ORDER — DICYCLOMINE HCL 20 MG PO TABS: 20.0000 mg | ORAL_TABLET | Freq: Two times a day (BID) | ORAL | Status: DC

## 2015-06-09 MED ORDER — ONDANSETRON HCL 4 MG/2ML IJ SOLN
4.0000 mg | Freq: Once | INTRAMUSCULAR | Status: AC
  Administered 2015-06-09: 4 mg via INTRAVENOUS
  Filled 2015-06-09: qty 2

## 2015-06-09 NOTE — Discharge Instructions (Signed)
You were seen in the emergency room today for evaluation of abdominal pain, nausea, vomiting, and diarrhea. Your symptoms are most likely due to a virus. I will give you prescriptions to take for nausea and for abdominal pain. You  May take over the counter Immodium as needed for diarrhea. Please drink plenty of fluids. Your kidney function test was slightly elevated today. This is most likely due to dehydration but please follow up with your primary care provider within one week to re-check your bloodwork. If you do not have a primary care provider please contact one from the list below.  Take medications as prescribed. Return to the emergency room for worsening condition or new concerning symptoms. Follow up with your regular doctor. If you don't have a regular doctor use one of the numbers below to establish a primary care doctor.   Emergency Department Resource Guide 1) Find a Doctor and Pay Out of Pocket Although you won't have to find out who is covered by your insurance plan, it is a good idea to ask around and get recommendations. You will then need to call the office and see if the doctor you have chosen will accept you as a new patient and what types of options they offer for patients who are self-pay. Some doctors offer discounts or will set up payment plans for their patients who do not have insurance, but you will need to ask so you aren't surprised when you get to your appointment.  2) Contact Your Local Health Department Not all health departments have doctors that can see patients for sick visits, but many do, so it is worth a call to see if yours does. If you don't know where your local health department is, you can check in your phone book. The CDC also has a tool to help you locate your state's health department, and many state websites also have listings of all of their local health departments.  3) Find a Walk-in Clinic If your illness is not likely to be very severe or complicated,  you may want to try a walk in clinic. These are popping up all over the country in pharmacies, drugstores, and shopping centers. They're usually staffed by nurse practitioners or physician assistants that have been trained to treat common illnesses and complaints. They're usually fairly quick and inexpensive. However, if you have serious medical issues or chronic medical problems, these are probably not your best option.  No Primary Care Doctor: - Call Health Connect at  [phone] - they can help you locate a primary care doctor that  accepts your insurance, provides certain services, etc. - Physician Referral [phone]  Emergency Department Resource Guide 1) Find a Doctor and Pay Out of Pocket Although you won't have to find out who is covered by your insurance plan, it is a good idea to ask around and get recommendations. You will then need to call the office and see if the doctor you have chosen will accept you as a new patient and what types of options they offer for patients who are self-pay. Some doctors offer discounts or will set up payment plans for their patients who do not have insurance, but you will need to ask so you aren't surprised when you get to your appointment.  2) Contact Your Local Health Department Not all health departments have doctors that can see patients for sick visits, but many do, so it is worth a call to see if yours does. If you don't know  where your local health department is, you can check in your phone book. The CDC also has a tool to help you locate your state's health department, and many state websites also have listings of all of their local health departments.  3) Find a Walk-in Clinic If your illness is not likely to be very severe or complicated, you may want to try a walk in clinic. These are popping up all over the country in pharmacies, drugstores, and shopping centers. They're usually staffed by nurse practitioners or physician assistants that  have been trained to treat common illnesses and complaints. They're usually fairly quick and inexpensive. However, if you have serious medical issues or chronic medical problems, these are probably not your best option.  No Primary Care Doctor: - Call Health Connect at  [phone] - they can help you locate a primary care doctor that  accepts your insurance, provides certain services, etc. - Physician Referral Service- [phone]  Chronic Pain Problems: Organization         Address  Phone   Notes  [facility]  [phone] Patients need to be referred by their primary care doctor.   Medication Assistance: Organization         Address  Phone   Notes  [facility] Medication [facility] [address] [place] [place], [place] [address] [phone] --Must be a resident of [facility] -- Must have NO insurance coverage whatsoever (no Medicaid/ Medicare, etc.) -- The pt. MUST have a primary care doctor that directs their care regularly and follows them in the community   [facility]  [phone]   [employer]  [phone]    Agencies that provide inexpensive medical care: Organization         Address  Phone   Notes  [facility]  [phone]   [facility]    [phone]   [facility] [address] [place], [place] [address] [phone]   [facility] [address] [place] [phone]   [facility]    [phone]   [facility]    [phone]   [facility] [address], [place] Phone:  [phone], Fax:  [phone] Hours of Operation:  9 am - 6 pm, M-F.  Also accepts Medicaid/Medicare and self-pay.  [facility]  [address], [place] Phone: [phone], Fax: [phone]. Hours of Operation:  8:30 am - 5:30 pm, M-F.  Also accepts Medicaid and  self-pay.  [facility] [place] [address] [place] Phone: [phone]   [facility] [address] [place]asha [place], [place] [phone], Ext. [phone] Mondays & Thursdays: 7-9 AM.  First 15 patients are seen on a first come, first serve basis.    Medicaid-accepting [facility] Providers:  Organization         Address  Phone   Notes  [facility] [address], [place] [phone] Also accepts self-pay patients.  [facility] [address] Laurell Josephs [place], [place]  [phone]   [facility] [address], [place] [phone]   [facility] [address] [place] [phone]   Renaye Rakers [address], [place]   [phone] Only accepts [employer]  patients after they have their name applied to their card.   Self-Pay (no insurance) in [place]:  Organization         Address  Phone   Notes  Sickle Cell Patients, [facility] [address], [place] [phone]   [facility] [address], [place] [phone]) [phone]   [facility] [place]  [address], [place] [phone]   [facility]/Dr. Osei-Bonsu  [address] [place] [address], [place] or [phone], [place] [phone] Phone number for both [place] and [place] locations is the same.  [facility] [address], [place] [phone]   [facility] [address] [place] [address], [place] or [address] [phone]) [phone]   [facility] [address] [place] [address], [place] [phone], phone; [phone], fax Sees patients 1st and 3rd Saturday of every month.  Must not qualify for public or private insurance (i.e. Medicaid, Medicare, [place] Health Choice, Veterans' Benefits)  Household income  should be no more than 200% of the poverty level The clinic cannot treat you if you are pregnant or think you are pregnant  Sexually transmitted diseases are not treated at the clinic.

## 2015-06-09 NOTE — ED Notes (Signed)
Pt. States he went to gym yesterday morning and afterwards started to feel nauseous when eating/drinking. Pt. States any fluid he drank he had diarrhea as well. Pt. States he was unable to go to work. Pt. States he feels SOB and fatigued.

## 2015-06-09 NOTE — ED Provider Notes (Signed)
CSN: [id number]     Arrival date & time 06/09/15  0734 History   First MD Initiated Contact with Patient 06/09/15 [phone]     Chief Complaint  Patient presents with  . Emesis    HPI   Mr. Shane Spinnerabron is an 35 y.o. male with no significant PMH who presents to the ED for evaluation of N/V/D. He states his symptoms began two days ago. He reports whenever he drinks something he feels like he has watery diarrhea immediately. States whenever he eats something heavier he experiences NBNB emesis. States he has been unable to keep anything down for the past two days. He also endorses associated intermittent generalized abdominal cramping. States he feels lightheaded and fatigued from being so dehydrated. He reports his daughter was sick with similar symptoms earlier this week but her symptoms resolved within one day. He endorses chills but is unsure if he has had fever at home. Denies chest pain or SOB. Denies BRBPR or black, tarry stools. He denies recent travel. States he still has his appendix and gallbladder. Denies EtOH, tobacco, or other drug use.  No past medical history on file. No past surgical history on file. No family history on file. Social History  Substance Use Topics  . Smoking status: Former Smoker    Quit date: 07/04/2012  . Smokeless tobacco: Not on file  . Alcohol Use: Yes     Comment: occasional    Review of Systems  All other systems reviewed and are negative.     Allergies  Review of patient's allergies indicates no known allergies.  Home Medications   Prior to Admission medications   Medication Sig Start Date End Date Taking? Authorizing Provider  ibuprofen (ADVIL,MOTRIN) 800 MG tablet Take 1 tablet (800 mg total) by mouth 3 (three) times daily with meals. Patient not taking: Reported on 09/10/2014 11/27/13   Mellody DrownLauren Parker, PA-C  methocarbamol (ROBAXIN) 500 MG tablet Take 2 tablets (1,000 mg total) by mouth 2 (two) times daily as needed for muscle spasms. Patient not  taking: Reported on 09/10/2014 01/16/14   Oswaldo ConroyVictoria Creech, PA-C  Misc Natural Products (NF FORMULAS TESTOSTERONE PO) Take 1 tablet by mouth 2 (two) times daily.    Historical Provider, MD  Multiple Vitamins-Minerals (MULTIVITAMIN PO) Take 1 tablet by mouth daily.    Historical Provider, MD  traMADol (ULTRAM) 50 MG tablet Take 1 tablet (50 mg total) by mouth every 6 (six) hours as needed. Patient not taking: Reported on 09/10/2014 11/27/13   Mellody DrownLauren Parker, PA-C   BP 139/69 mmHg  Pulse 80  Temp(Src) 98.2 F (36.8 C) (Oral)  Resp 18  Ht 6\' 2"  (1.88 m)  Wt 102.967 kg  BMI 29.13 kg/m2  SpO2 96% Physical Exam  Constitutional: He is oriented to person, place, and time.  HENT:  Right Ear: External ear normal.  Left Ear: External ear normal.  Nose: Nose normal.  Mouth/Throat: Oropharynx is clear and moist and mucous membranes are normal. No oropharyngeal exudate.  Eyes: Conjunctivae and EOM are normal. Pupils are equal, round, and reactive to light.  Neck: Normal range of motion. Neck supple.  Cardiovascular: Normal rate, regular rhythm, normal heart sounds and intact distal pulses.   Pulmonary/Chest: Effort normal and breath sounds normal. No respiratory distress. He has no wheezes. He exhibits no tenderness.  Abdominal: Soft. Bowel sounds are normal. He exhibits no distension. There is no tenderness. There is no rebound and no guarding.  Musculoskeletal: He exhibits no edema.  Lymphadenopathy:  He has no cervical adenopathy.  Neurological: He is alert and oriented to person, place, and time. No cranial nerve deficit.  Skin: Skin is warm and dry. There is pallor.  Psychiatric: He has a normal mood and affect.  Nursing note and vitals reviewed.  Filed Vitals:   06/09/15 0745 06/09/15 0800 06/09/15 0845 06/09/15 0915  BP: 126/83 116/76 111/63 124/55  Pulse: 81 76 61 73  Temp:      TempSrc:      Resp:   18 18  Height:      Weight:      SpO2: 96% 96% 98% 97%     ED Course   Procedures (including critical care time) Labs Review Labs Reviewed  COMPREHENSIVE METABOLIC PANEL - Abnormal; Notable for the following:    Glucose, Bld 104 (*)    Creatinine, Ser 1.62 (*)    Calcium 8.3 (*)    Total Protein 5.6 (*)    Albumin 3.3 (*)    GFR calc non Af Amer 54 (*)    All other components within normal limits  LIPASE, BLOOD  CBC WITH DIFFERENTIAL/PLATELET    Imaging Review No results found. I have personally reviewed and evaluated these images and lab results as part of my medical decision-making.   EKG Interpretation None      MDM   Final diagnoses:  Non-intractable vomiting with nausea, vomiting of unspecified type  Diarrhea, unspecified type    Suspect viral gastroenteritis. Will give 1L NS bolus and zofran, will check cbc, cmp, and lipase. Pt's abdominal exam is nonfocal with no peritoneal signs. He is afebrile and not tachycardic. I have low suspicion for appendicitis, cholecystitis, pancreatitis, SBO, or other acute intraabdominal pathology.   Pt feeling much improved after fluid bolus and zofran. He is tolerating PO. His vitals are unremarkable. His labs do show some renal insufficiency which I suspect is likely due to poor PO intake/dehydration recently. Instructed pt to f/u with pcp and resource guide given. Rx given for zofran and bentyl. Work note given. ER return precautions given.   Carlene Coria, PA-C 06/09/15 4098  Rolan Bucco, MD 06/09/15 [phone]

## 2016-02-05 IMAGING — CR DG ANKLE COMPLETE 3+V*R*
3 series · 3 of 3 positions shown · non-contrast
Comparison: None.

CLINICAL DATA: Clinical

EXAM:
RIGHT ANKLE - COMPLETE 3+ VIEW

[x ankle ap right]
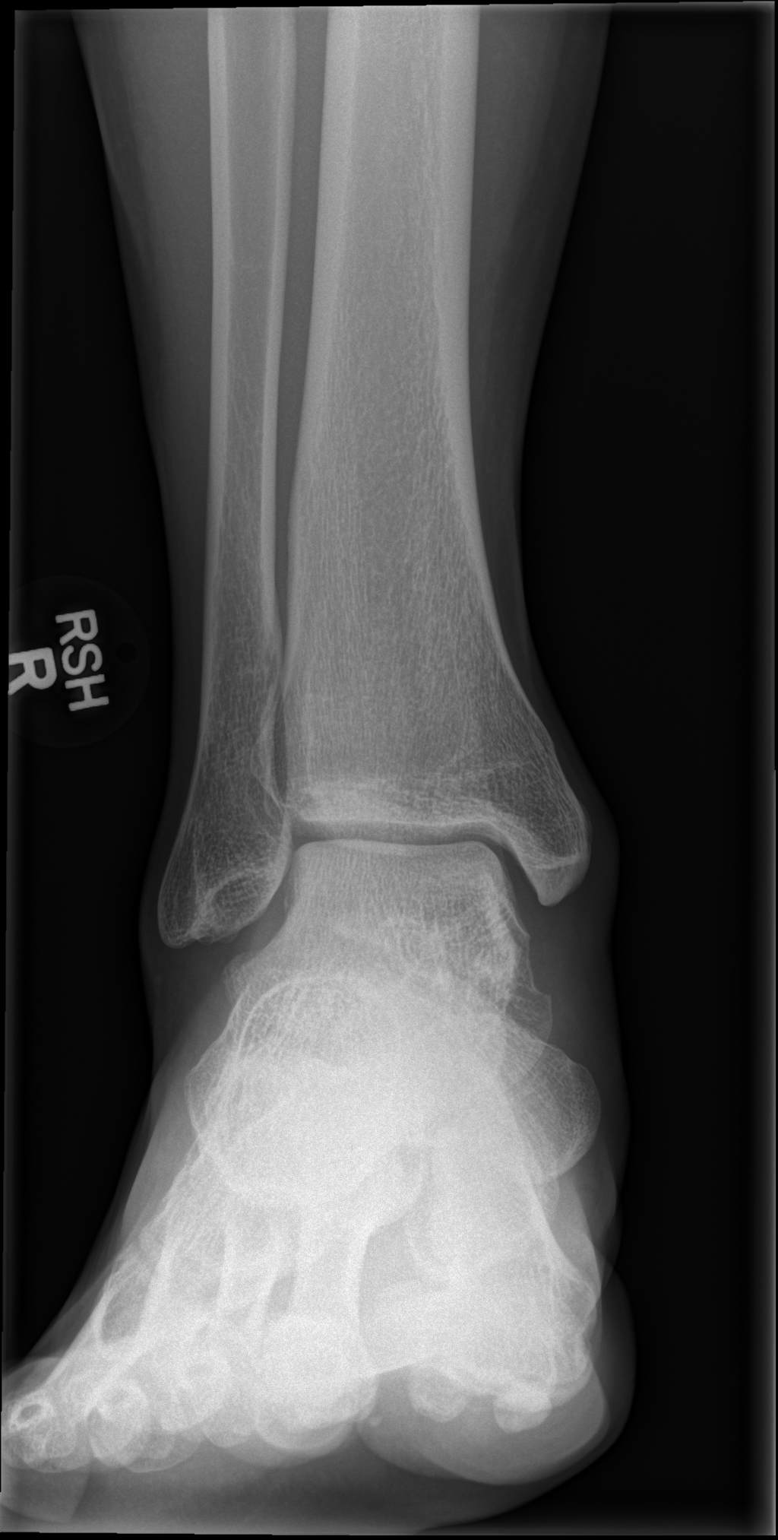

[x ankle obl right]
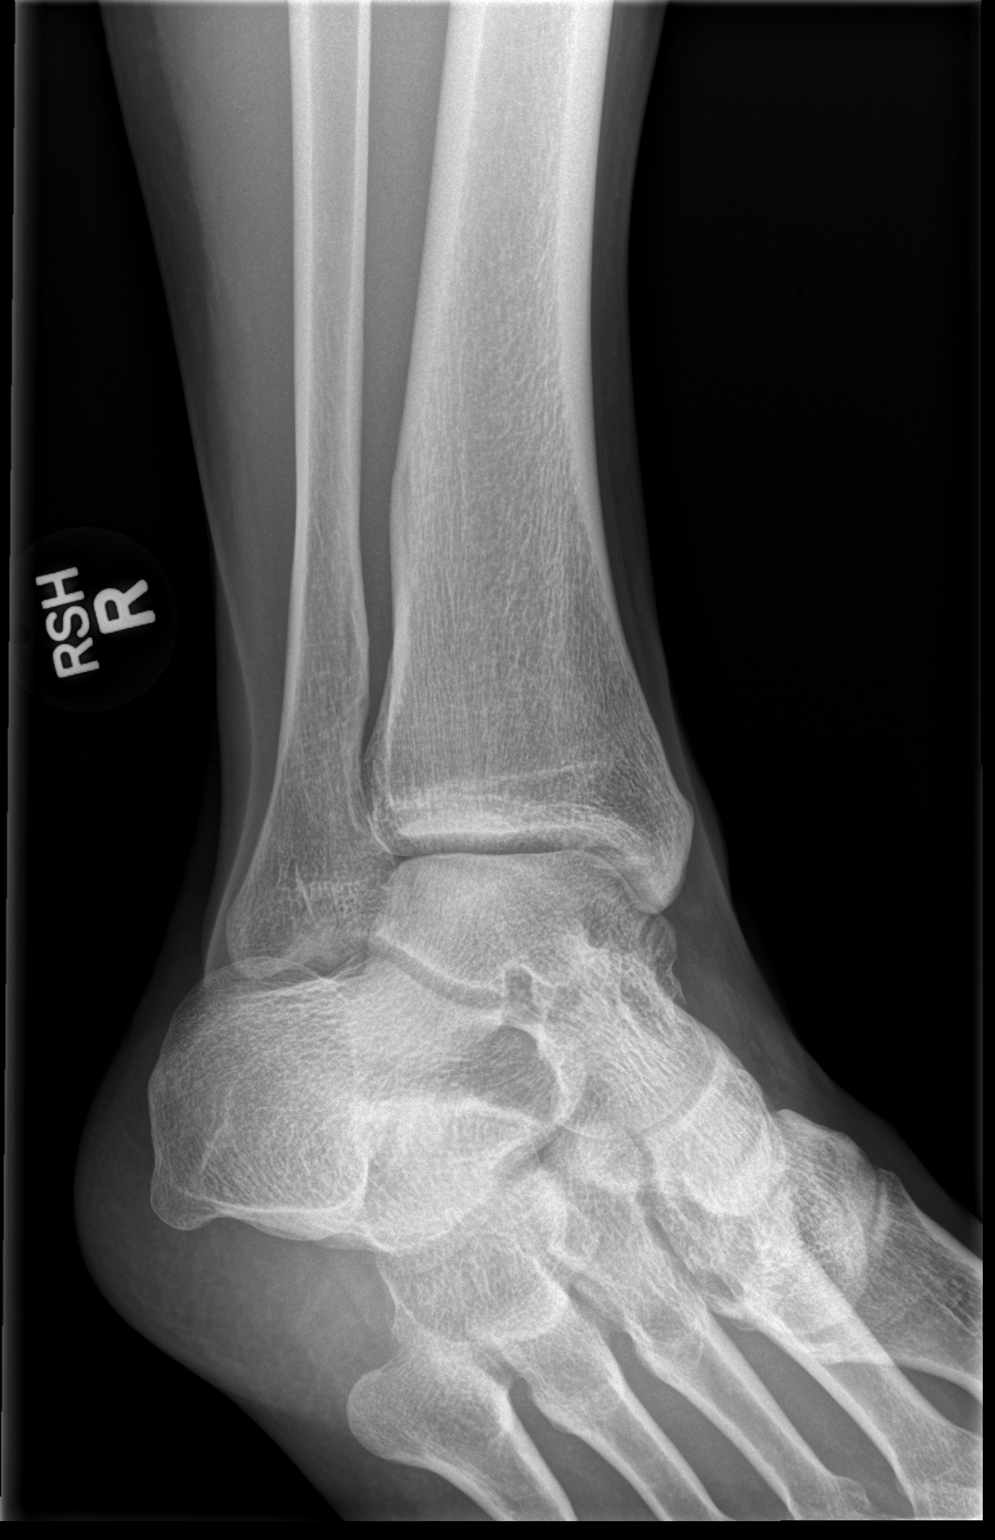

[x ankle lat right]
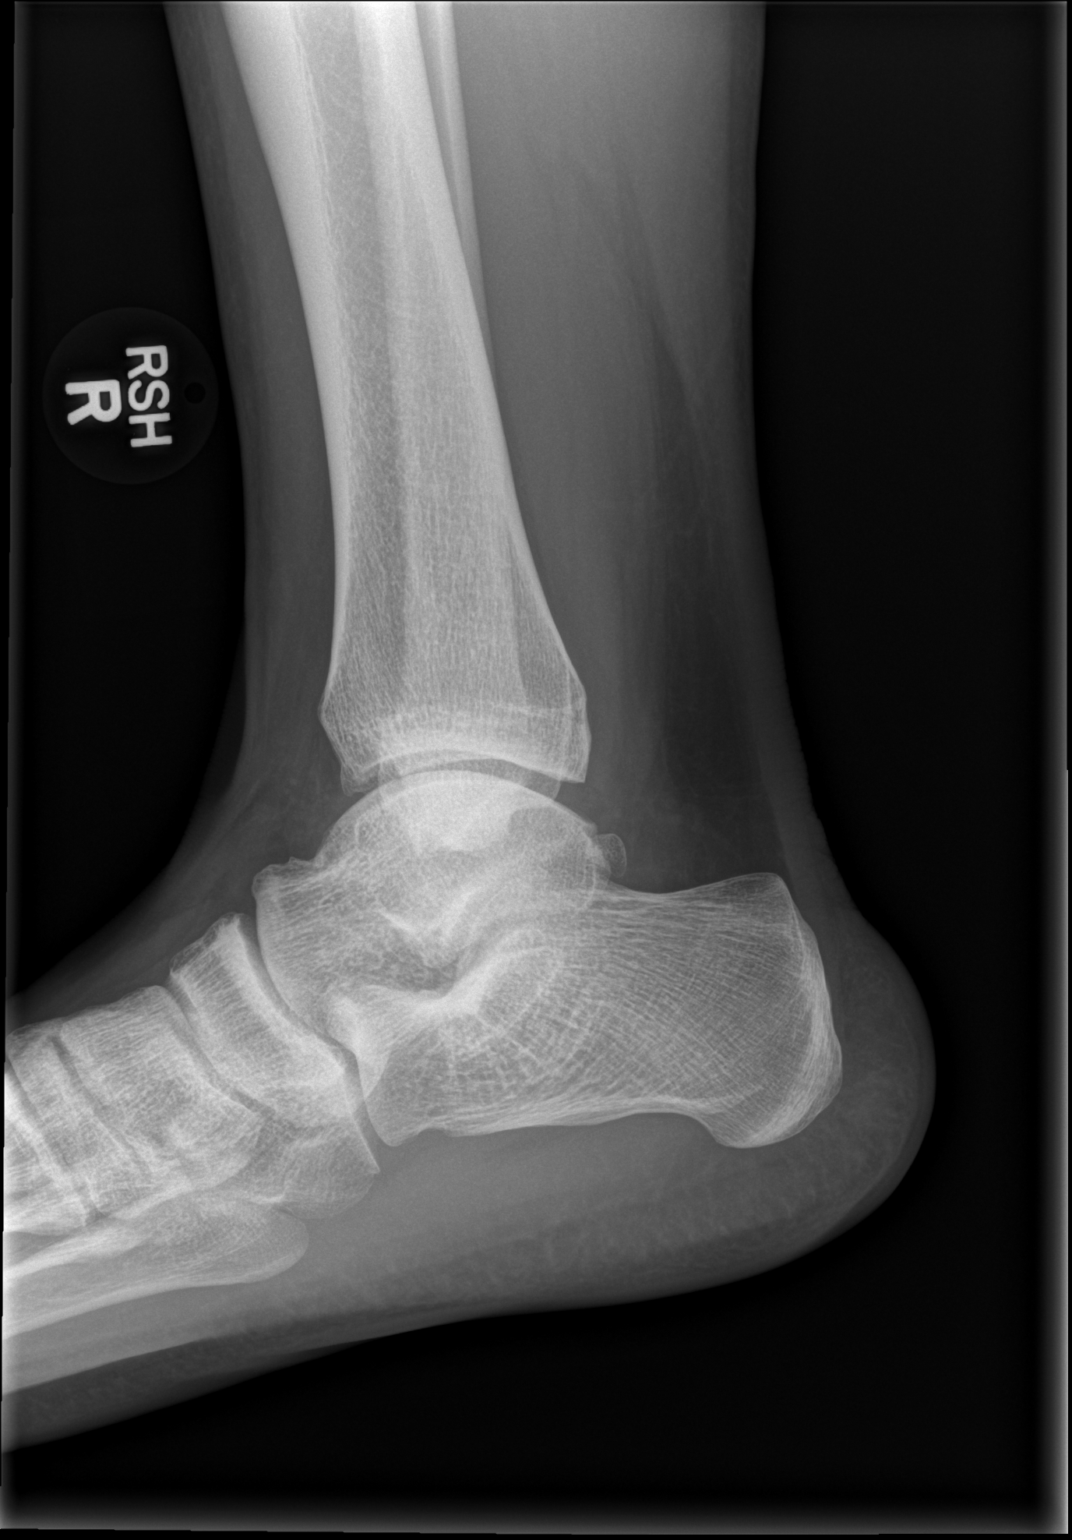

[3 of 3 positions shown; findings below may reference images not displayed]

FINDINGS: Frontal, oblique, and lateral views were obtained. There is no
fracture or effusion. Ankle mortise appears intact. No erosive
change.
IMPRESSION: No fracture.  Mortise intact.

## 2016-03-16 ENCOUNTER — Encounter (HOSPITAL_BASED_OUTPATIENT_CLINIC_OR_DEPARTMENT_OTHER): Payer: Self-pay | Admitting: *Deleted

## 2016-03-16 ENCOUNTER — Emergency Department (HOSPITAL_BASED_OUTPATIENT_CLINIC_OR_DEPARTMENT_OTHER)
Admission: EM | Admit: 2016-03-16 | Discharge: 2016-03-16 | Disposition: A | Discharge status: Home / Self Care | Payer: BLUE CROSS/BLUE SHIELD | Attending: Emergency Medicine | Admitting: Emergency Medicine

## 2016-03-16 DIAGNOSIS — N342 Other urethritis: Secondary | ICD-10-CM | POA: Insufficient documentation

## 2016-03-16 DIAGNOSIS — Z202 Contact with and (suspected) exposure to infections with a predominantly sexual mode of transmission: Secondary | ICD-10-CM | POA: Diagnosis present

## 2016-03-16 DIAGNOSIS — Z87891 Personal history of nicotine dependence: Secondary | ICD-10-CM | POA: Insufficient documentation

## 2016-03-16 MED ORDER — LIDOCAINE HCL (PF) 1 % IJ SOLN
INTRAMUSCULAR | Status: AC
  Administered 2016-03-16: 16:00:00
  Filled 2016-03-16: qty 5

## 2016-03-16 MED ORDER — AZITHROMYCIN 250 MG PO TABS
1000.0000 mg | ORAL_TABLET | Freq: Once | ORAL | Status: AC
  Administered 2016-03-16: 1000 mg via ORAL
  Filled 2016-03-16: qty 4

## 2016-03-16 MED ORDER — CEFTRIAXONE SODIUM 250 MG IJ SOLR
250.0000 mg | Freq: Once | INTRAMUSCULAR | Status: AC
  Administered 2016-03-16: 250 mg via INTRAMUSCULAR
  Filled 2016-03-16: qty 250

## 2016-03-16 NOTE — ED Provider Notes (Signed)
  [facility] Provider Note   CSN: [id number] Arrival date & time: 03/16/16  1358     History   Chief Complaint Chief Complaint  Patient presents with  . Exposure to STD    HPI Shane Lopez is a 35 y.o. male.  The history is provided by the patient. No language interpreter was used.  Exposure to STD  This is a new problem. The problem occurs constantly. The problem has been gradually worsening. Nothing aggravates the symptoms. Nothing relieves the symptoms. He has tried nothing for the symptoms. The treatment provided no relief.  Pt complains of a discahrge and burning.  Pt has a knot he wants looked at,.  History reviewed. No pertinent past medical history.  There are no active problems to display for this patient.   History reviewed. No pertinent surgical history.     Home Medications    Prior to Admission medications   Medication Sig Start Date End Date Taking? Authorizing Provider  Multiple Vitamins-Minerals (MULTIVITAMIN PO) Take 1 tablet by mouth daily.    Historical Provider, MD    Family History History reviewed. No pertinent family history.  Social History Social History  Substance Use Topics  . Smoking status: Former Smoker    Quit date: 07/04/2012  . Smokeless tobacco: Not on file  . Alcohol use Yes     Comment: occasional     Allergies   Review of patient's allergies indicates no known allergies.   Review of Systems Review of Systems  All other systems reviewed and are negative.    Physical Exam Updated Vital Signs BP 135/84   Pulse 81   Temp 98.1 F (36.7 C)   Resp 16   Ht 6' 1.5" (1.867 m)   Wt 101.2 kg   SpO2 99%   BMI 29.02 kg/m   Physical Exam  Constitutional: He appears well-developed and well-nourished.  Cardiovascular: Normal rate.   Pulmonary/Chest: Effort normal.  Genitourinary: No penile tenderness.  Neurological: He is alert.  Skin: Skin is warm.  Psychiatric: He has a normal mood and  affect.  Nursing note and vitals reviewed.    ED Treatments / Results  Labs (all labs ordered are listed, but only abnormal results are displayed) Labs Reviewed  GC/CHLAMYDIA PROBE AMP ([place] NOT AT [facility]    EKG  EKG Interpretation None       Radiology No results found.  Procedures Procedures (including critical care time)  Medications Ordered in ED Medications  cefTRIAXone (ROCEPHIN) injection 250 mg (not administered)  azithromycin (ZITHROMAX) tablet 1,000 mg (not administered)     Initial Impression / Assessment and Plan / ED Course  I have reviewed the triage vital signs and the nursing notes.  Pertinent labs & imaging results that were available during my care of the patient were reviewed by me and considered in my medical decision making (see chart for details).  Clinical Course    Pt has scattered warts on dorsal penis,  White discharge.   Final Clinical Impressions(s) / ED Diagnoses   Final diagnoses:  Urethritis    New Prescriptions New Prescriptions   No medications on file  Pt advised to go to the Health department for treatment of warts.   Lonia SkinnerLeslie K Severna ParkSofia, PA-C 03/16/16 1532    Nelva Nayobert Beaton, MD 03/17/16 [phone]

## 2016-03-16 NOTE — ED Triage Notes (Signed)
Pt c/o penis discharge x 1 day 

## 2016-03-18 LAB — GC/CHLAMYDIA PROBE AMP (~~LOC~~) NOT AT ARMC
CHLAMYDIA, DNA PROBE: NEGATIVE
Neisseria Gonorrhea: NEGATIVE

## 2017-07-15 ENCOUNTER — Encounter (HOSPITAL_COMMUNITY): Payer: Self-pay | Admitting: Emergency Medicine

## 2017-07-15 ENCOUNTER — Emergency Department (HOSPITAL_COMMUNITY)
Admission: EM | Admit: 2017-07-15 | Discharge: 2017-07-16 | Discharge status: Against Medical Advice | Payer: BLUE CROSS/BLUE SHIELD | Attending: Emergency Medicine | Admitting: Emergency Medicine

## 2017-07-15 DIAGNOSIS — Z87891 Personal history of nicotine dependence: Secondary | ICD-10-CM | POA: Insufficient documentation

## 2017-07-15 DIAGNOSIS — Z79899 Other long term (current) drug therapy: Secondary | ICD-10-CM | POA: Insufficient documentation

## 2017-07-15 DIAGNOSIS — M545 Low back pain: Secondary | ICD-10-CM | POA: Insufficient documentation

## 2017-07-15 LAB — CBC WITH DIFFERENTIAL/PLATELET
BASOS PCT: 0 %
Basophils Absolute: 0 10*3/uL (ref 0.0–0.1)
EOS ABS: 0.2 10*3/uL (ref 0.0–0.7)
Eosinophils Relative: 2 %
HEMATOCRIT: 49.2 % (ref 39.0–52.0)
Hemoglobin: 17.1 g/dL — ABNORMAL HIGH (ref 13.0–17.0)
Lymphocytes Relative: 29 %
Lymphs Abs: 2.5 10*3/uL (ref 0.7–4.0)
MCH: 33.5 pg (ref 26.0–34.0)
MCHC: 34.8 g/dL (ref 30.0–36.0)
MCV: 96.5 fL (ref 78.0–100.0)
MONOS PCT: 9 %
Monocytes Absolute: 0.8 10*3/uL (ref 0.1–1.0)
NEUTROS ABS: 5.1 10*3/uL (ref 1.7–7.7)
Neutrophils Relative %: 60 %
Platelets: 191 10*3/uL (ref 150–400)
RBC: 5.1 MIL/uL (ref 4.22–5.81)
RDW: 13.1 % (ref 11.5–15.5)
WBC: 8.7 10*3/uL (ref 4.0–10.5)

## 2017-07-15 LAB — BASIC METABOLIC PANEL
Anion gap: 10 (ref 5–15)
BUN: 15 mg/dL (ref 6–20)
CALCIUM: 8.7 mg/dL — AB (ref 8.9–10.3)
CO2: 23 mmol/L (ref 22–32)
CREATININE: 1.46 mg/dL — AB (ref 0.61–1.24)
Chloride: 103 mmol/L (ref 101–111)
GFR calc non Af Amer: >60 mL/min (ref >60)
Glucose, Bld: 79 mg/dL (ref 65–99)
Potassium: 4 mmol/L (ref 3.5–5.1)
Sodium: 136 mmol/L (ref 135–145)

## 2017-07-15 MED ORDER — DEXAMETHASONE SODIUM PHOSPHATE 10 MG/ML IJ SOLN
10.0000 mg | Freq: Once | INTRAMUSCULAR | Status: AC
  Administered 2017-07-15: 10 mg via INTRAVENOUS
  Filled 2017-07-15: qty 1

## 2017-07-15 MED ORDER — HYDROMORPHONE HCL 1 MG/ML IJ SOLN
1.0000 mg | Freq: Once | INTRAMUSCULAR | Status: AC
  Administered 2017-07-15: 1 mg via INTRAMUSCULAR
  Filled 2017-07-15: qty 1

## 2017-07-15 MED ORDER — METHOCARBAMOL 500 MG PO TABS
500.0000 mg | ORAL_TABLET | Freq: Once | ORAL | Status: AC
  Administered 2017-07-15: 500 mg via ORAL
  Filled 2017-07-15: qty 1

## 2017-07-15 MED ORDER — LORAZEPAM 2 MG/ML IJ SOLN
1.0000 mg | Freq: Once | INTRAMUSCULAR | Status: AC
  Administered 2017-07-15: 1 mg via INTRAVENOUS
  Filled 2017-07-15: qty 1

## 2017-07-15 MED ORDER — SODIUM CHLORIDE 0.9 % IV SOLN
INTRAVENOUS | Status: DC
  Administered 2017-07-15: 22:00:00 via INTRAVENOUS

## 2017-07-15 NOTE — ED Triage Notes (Signed)
Brought by ems from home after lifting 600 lbs three days ago.  Reports throwing his back out.  Sharp pain in lower back.  Reports to me that he hasn't taken anything but told another nurse ibuprofen.  Noted to be drowsy in triage.  Was not given anything via ems.

## 2017-07-15 NOTE — ED Provider Notes (Signed)
[facility] EM[facility]ENCY [facility] Provider Note   CSN: [id number] Arrival date & time: 07/15/17  [id number]     History   Chief Complaint Chief Complaint  Patient presents with  . Back Pain    HPI Carita PianWilliam Lamont Knipfer is a 37 y.o. male.  37 year old male who injured his lower back several days ago while power lifting.  States he was lifting 600 pounds and follow pop in his lower back and has had severe pain which is not radiating in his lower lumbar spine times several days.  No bowel or bladder dysfunction.  No perineal numbness.  Has been using ibuprofen without relief.  Pain better with remaining still and worse with any movement.  No prior history of back surgery.      History reviewed. No pertinent past medical history.  There are no active problems to display for this patient.   History reviewed. No pertinent surgical history.     Home Medications    Prior to Admission medications   Medication Sig Start Date End Date Taking? Authorizing Provider  Multiple Vitamins-Minerals (MULTIVITAMIN PO) Take 1 tablet by mouth daily.    [provider]    Family History No family history on file.  Social History Social History   Tobacco Use  . Smoking status: Former Smoker    Last attempt to quit: 07/04/2012    Years since quitting: 5.0  . Smokeless tobacco: Never Used  Substance Use Topics  . Alcohol use: Yes    Comment: occasional  . Drug use: Yes    Types: Marijuana     Allergies   Patient has no known allergies.   Review of Systems Review of Systems  All other systems reviewed and are negative.    Physical Exam Updated Vital Signs BP (!) 163/86 (BP Location: Right Arm)   Pulse 65   Temp 98.3 F (36.8 C) (Oral)   Resp 16   Ht 1.88 m (6\' 2" )   Wt 111.1 kg (245 lb)   SpO2 95%   BMI 31.46 kg/m   Physical Exam  Constitutional: He is oriented to person, place, and time. He appears well-developed and well-nourished.   Non-toxic appearance. No distress.  HENT:  Head: Normocephalic and atraumatic.  Eyes: Conjunctivae, EOM and lids are normal. Pupils are equal, round, and reactive to light.  Neck: Normal range of motion. Neck supple. No tracheal deviation present. No thyroid mass present.  Cardiovascular: Normal rate, regular rhythm and normal heart sounds. Exam reveals no gallop.  No murmur heard. Pulmonary/Chest: Effort normal and breath sounds normal. No stridor. No respiratory distress. He has no decreased breath sounds. He has no wheezes. He has no rhonchi. He has no rales.  Abdominal: Soft. Normal appearance and bowel sounds are normal. He exhibits no distension. There is no tenderness. There is no rebound and no CVA tenderness.  Musculoskeletal: Normal range of motion. He exhibits no edema.       Lumbar back: He exhibits tenderness and bony tenderness.       Back:  Neurological: He is alert and oriented to person, place, and time. He has normal strength. No cranial nerve deficit or sensory deficit. GCS eye subscore is 4. GCS verbal subscore is 5. GCS motor subscore is 6.  Reflex Scores:      Patellar reflexes are 1+ on the right side and 1+ on the left side. Skin: Skin is warm and dry. No abrasion and no rash noted.  Psychiatric: He has a  normal mood and affect. His speech is normal and behavior is normal.  Nursing note and vitals reviewed.    ED Treatments / Results  Labs (all labs ordered are listed, but only abnormal results are displayed) Labs Reviewed  CBC WITH DIFFERENTIAL/PLATELET  BASIC METABOLIC PANEL    EKG  EKG Interpretation None       Radiology No results found.  Procedures Procedures (including critical care time)  Medications Ordered in ED Medications  0.9 %  sodium chloride infusion (not administered)  dexamethasone (DECADRON) injection 10 mg (not administered)  LORazepam (ATIVAN) injection 1 mg (not administered)  HYDROmorphone (DILAUDID) injection 1 mg (1 mg  Intramuscular Given 07/15/17 2036)  methocarbamol (ROBAXIN) tablet 500 mg (500 mg Oral Given 07/15/17 2036)     Initial Impression / Assessment and Plan / ED Course  I have reviewed the triage vital signs and the nursing notes.  Pertinent labs & imaging results that were available during my care of the patient were reviewed by me and considered in my medical decision making (see chart for details).     Patient given pain medication here.  MRI of back is pending.  Care assigned to Dr. Fayrene Fearing  Final Clinical Impressions(s) / ED Diagnoses   Final diagnoses:  None    ED Discharge Orders    None       Lorre Nick, MD 07/15/17 2335

## 2017-07-16 ENCOUNTER — Ambulatory Visit (HOSPITAL_COMMUNITY): Payer: BLUE CROSS/BLUE SHIELD

## 2017-07-16 NOTE — ED Notes (Signed)
Patient and family member resting in hallway

## 2017-07-16 NOTE — ED Notes (Signed)
Called MRI for update

## 2017-07-16 NOTE — ED Notes (Signed)
Pt heard cursing in hallway. Walked around the ED looking at the computers. When asked if he needed anything, he replied "No!". Pt walked back to stretcher and threw cup down on the floor and stated "Fuck this [facility] hospital". Pt threw more stuff on the floor and stated "I'll give these assholes something to do and clean up". Pt aware that he was in line for MRI. Pt seen walking out with friend, with steady gait and in no distress. Charge aware.

## 2017-07-16 NOTE — ED Notes (Signed)
Spouse updated about "place in line" for MRI. Pt is asleep and resting

## 2017-07-16 NOTE — ED Provider Notes (Signed)
Examined this patient at 04 100.  Is resting comfortably.  He climbed any additional medications.  At 0700 he removed his monitors.  Self removed his IV and left the premises.  He was ambulatory with a normal gait.  He was approached by nursing, however replied with an array of profanities and would not stop her be convinced to return for his MRI which was literally within minutes of being done after an 11-hour wait.   Rolland PorterJames, Melanni Benway, MD 07/16/17 [phone]

## 2017-07-16 NOTE — ED Notes (Signed)
Pt ambulatory in hall  

## 2021-06-10 ENCOUNTER — Ambulatory Visit: Payer: BLUE CROSS/BLUE SHIELD | Admitting: Family Medicine

## 2021-11-02 DIAGNOSIS — M545 Low back pain, unspecified: Secondary | ICD-10-CM | POA: Diagnosis not present

## 2021-11-02 DIAGNOSIS — M25512 Pain in left shoulder: Secondary | ICD-10-CM | POA: Diagnosis not present

## 2021-11-02 DIAGNOSIS — Z114 Encounter for screening for human immunodeficiency virus [HIV]: Secondary | ICD-10-CM | POA: Diagnosis not present

## 2021-11-02 DIAGNOSIS — Z1159 Encounter for screening for other viral diseases: Secondary | ICD-10-CM | POA: Diagnosis not present

## 2021-11-02 DIAGNOSIS — E669 Obesity, unspecified: Secondary | ICD-10-CM | POA: Diagnosis not present

## 2021-11-03 DIAGNOSIS — M25512 Pain in left shoulder: Secondary | ICD-10-CM | POA: Diagnosis not present

## 2021-11-03 DIAGNOSIS — M5442 Lumbago with sciatica, left side: Secondary | ICD-10-CM | POA: Diagnosis not present

## 2021-11-05 ENCOUNTER — Other Ambulatory Visit: Payer: Self-pay | Admitting: Sports Medicine

## 2021-11-05 DIAGNOSIS — M25512 Pain in left shoulder: Secondary | ICD-10-CM

## 2021-11-21 ENCOUNTER — Other Ambulatory Visit: Payer: BLUE CROSS/BLUE SHIELD

## 2022-09-15 ENCOUNTER — Encounter: Payer: Self-pay | Admitting: *Deleted

## 2023-04-20 ENCOUNTER — Emergency Department (HOSPITAL_BASED_OUTPATIENT_CLINIC_OR_DEPARTMENT_OTHER): Payer: 59

## 2023-04-20 ENCOUNTER — Other Ambulatory Visit: Payer: Self-pay

## 2023-04-20 ENCOUNTER — Encounter (HOSPITAL_BASED_OUTPATIENT_CLINIC_OR_DEPARTMENT_OTHER): Payer: Self-pay | Admitting: Radiology

## 2023-04-20 ENCOUNTER — Emergency Department (HOSPITAL_BASED_OUTPATIENT_CLINIC_OR_DEPARTMENT_OTHER)
Admission: EM | Admit: 2023-04-20 | Discharge: 2023-04-20 | Discharge status: Against Medical Advice | Payer: 59 | Attending: Emergency Medicine | Admitting: Emergency Medicine

## 2023-04-20 DIAGNOSIS — R109 Unspecified abdominal pain: Secondary | ICD-10-CM | POA: Diagnosis present

## 2023-04-20 DIAGNOSIS — K852 Alcohol induced acute pancreatitis without necrosis or infection: Secondary | ICD-10-CM | POA: Insufficient documentation

## 2023-04-20 DIAGNOSIS — R7989 Other specified abnormal findings of blood chemistry: Secondary | ICD-10-CM | POA: Diagnosis not present

## 2023-04-20 DIAGNOSIS — K859 Acute pancreatitis without necrosis or infection, unspecified: Secondary | ICD-10-CM | POA: Diagnosis present

## 2023-04-20 LAB — COMPREHENSIVE METABOLIC PANEL
ALT: 26 U/L (ref 0–44)
AST: 39 U/L (ref 15–41)
Albumin: 4.1 g/dL (ref 3.5–5.0)
Alkaline Phosphatase: 35 U/L — ABNORMAL LOW (ref 38–126)
Anion gap: 7 (ref 5–15)
BUN: 8 mg/dL (ref 6–20)
CO2: 29 mmol/L (ref 22–32)
Calcium: 9.8 mg/dL (ref 8.9–10.3)
Chloride: 101 mmol/L (ref 98–111)
Creatinine, Ser: 1.54 mg/dL — ABNORMAL HIGH (ref 0.61–1.24)
GFR, Estimated: 58 mL/min — ABNORMAL LOW (ref >60)
Glucose, Bld: 89 mg/dL (ref 70–99)
Potassium: 4.5 mmol/L (ref 3.5–5.1)
Sodium: 137 mmol/L (ref 135–145)
Total Bilirubin: 0.8 mg/dL (ref <1.2)
Total Protein: 6.8 g/dL (ref 6.5–8.1)

## 2023-04-20 LAB — URINALYSIS, ROUTINE W REFLEX MICROSCOPIC
Bacteria, UA: NONE SEEN
Bilirubin Urine: NEGATIVE
Glucose, UA: NEGATIVE mg/dL
Hgb urine dipstick: NEGATIVE
Ketones, ur: 15 mg/dL — AB
Leukocytes,Ua: NEGATIVE
Nitrite: NEGATIVE
Protein, ur: 30 mg/dL — AB
Specific Gravity, Urine: >1.046 — ABNORMAL HIGH (ref 1.005–1.030)
pH: 7 (ref 5.0–8.0)

## 2023-04-20 LAB — CBC
HCT: 52.4 % — ABNORMAL HIGH (ref 39.0–52.0)
Hemoglobin: 18.1 g/dL — ABNORMAL HIGH (ref 13.0–17.0)
MCH: 33.3 pg (ref 26.0–34.0)
MCHC: 34.5 g/dL (ref 30.0–36.0)
MCV: 96.3 fL (ref 80.0–100.0)
Platelets: 227 10*3/uL (ref 150–400)
RBC: 5.44 MIL/uL (ref 4.22–5.81)
RDW: 14.2 % (ref 11.5–15.5)
WBC: 9.3 10*3/uL (ref 4.0–10.5)
nRBC: 0 % (ref 0.0–0.2)

## 2023-04-20 LAB — RAPID URINE DRUG SCREEN, HOSP PERFORMED
Amphetamines: NOT DETECTED
Barbiturates: NOT DETECTED
Benzodiazepines: NOT DETECTED
Cocaine: NOT DETECTED
Opiates: POSITIVE — AB
Tetrahydrocannabinol: POSITIVE — AB

## 2023-04-20 LAB — LIPASE, BLOOD: Lipase: 265 U/L — ABNORMAL HIGH (ref 11–51)

## 2023-04-20 LAB — ETHANOL: Alcohol, Ethyl (B): <10 mg/dL (ref <10)

## 2023-04-20 MED ORDER — HYDROMORPHONE HCL 1 MG/ML IJ SOLN
1.0000 mg | Freq: Once | INTRAMUSCULAR | Status: AC
  Administered 2023-04-20: 1 mg via INTRAVENOUS
  Filled 2023-04-20: qty 1

## 2023-04-20 MED ORDER — ALUM & MAG HYDROXIDE-SIMETH 200-200-20 MG/5ML PO SUSP
30.0000 mL | Freq: Once | ORAL | Status: AC
  Administered 2023-04-20: 30 mL via ORAL
  Filled 2023-04-20: qty 30

## 2023-04-20 MED ORDER — SODIUM CHLORIDE 0.9 % IV BOLUS
1000.0000 mL | Freq: Once | INTRAVENOUS | Status: AC
  Administered 2023-04-20: 1000 mL via INTRAVENOUS

## 2023-04-20 MED ORDER — LIDOCAINE VISCOUS HCL 2 % MT SOLN
15.0000 mL | Freq: Once | OROMUCOSAL | Status: AC
  Administered 2023-04-20: 15 mL via OROMUCOSAL
  Filled 2023-04-20: qty 15

## 2023-04-20 MED ORDER — ONDANSETRON HCL 4 MG/2ML IJ SOLN
4.0000 mg | Freq: Once | INTRAMUSCULAR | Status: AC
  Administered 2023-04-20: 4 mg via INTRAVENOUS
  Filled 2023-04-20: qty 2

## 2023-04-20 MED ORDER — IOHEXOL 300 MG/ML  SOLN
100.0000 mL | Freq: Once | INTRAMUSCULAR | Status: AC | PRN
  Administered 2023-04-20: 100 mL via INTRAVENOUS

## 2023-04-20 MED ORDER — FOLIC ACID 1 MG PO TABS
1.0000 mg | ORAL_TABLET | Freq: Every day | ORAL | Status: DC
  Administered 2023-04-20: 1 mg via ORAL
  Filled 2023-04-20: qty 1

## 2023-04-20 MED ORDER — LACTATED RINGERS IV SOLN: INTRAVENOUS | Status: DC

## 2023-04-20 MED ORDER — ADULT MULTIVITAMIN W/MINERALS CH
1.0000 | ORAL_TABLET | Freq: Every day | ORAL | Status: DC
  Administered 2023-04-20: 1 via ORAL
  Filled 2023-04-20: qty 1

## 2023-04-20 MED ORDER — LORAZEPAM 2 MG/ML IJ SOLN: 1.0000 mg | INTRAMUSCULAR | Status: DC | PRN

## 2023-04-20 MED ORDER — THIAMINE MONONITRATE 100 MG PO TABS
100.0000 mg | ORAL_TABLET | Freq: Every day | ORAL | Status: DC
  Administered 2023-04-20: 100 mg via ORAL
  Filled 2023-04-20: qty 1

## 2023-04-20 MED ORDER — ONDANSETRON HCL 4 MG/2ML IJ SOLN: 4.0000 mg | Freq: Once | INTRAMUSCULAR | Status: DC | PRN

## 2023-04-20 MED ORDER — THIAMINE HCL 100 MG/ML IJ SOLN: 100.0000 mg | Freq: Every day | INTRAMUSCULAR | Status: DC

## 2023-04-20 MED ORDER — LORAZEPAM 1 MG PO TABS
1.0000 mg | ORAL_TABLET | ORAL | Status: DC | PRN
  Administered 2023-04-20: 1 mg via ORAL
  Filled 2023-04-20: qty 1

## 2023-04-20 MED ORDER — HYDROMORPHONE HCL 1 MG/ML IJ SOLN
1.0000 mg | INTRAMUSCULAR | Status: DC | PRN
  Administered 2023-04-20: 1 mg via INTRAVENOUS
  Filled 2023-04-20: qty 1

## 2023-04-20 MED ORDER — LACTATED RINGERS IV BOLUS
2000.0000 mL | Freq: Once | INTRAVENOUS | Status: AC
  Administered 2023-04-20: 2000 mL via INTRAVENOUS

## 2023-04-20 NOTE — Care Plan (Signed)
Plan of Care Note for accepted transfer   Patient: Trowa Kindrick MRN: [id number]   DOA: 04/20/2023  Facility requesting transfer: [facility] Requesting Provider: Dr. Doran Durand Reason for transfer: Acute pancreatitis/uncontrolled pain Facility course: Patient is a 42 year old gentleman history of opioid dependence being followed at the methadone clinic otherwise healthy presented to the ED with evaluation of sudden onset acute abdominal pain that started on the morning of admission around 8 AM and worsening since.  Patient with noted nausea and emesis.  It is noted per ED physician that patient does report drinking more heavily over the past 3 months drinking wine every morning with last drink on the morning of admission.  Patient noted to have elevated lipase levels of 265.  Comprehensive metabolic profile with a creatinine 1.54 otherwise within normal limits.  CBC with a hemoglobin of 18.1 otherwise within normal limits.  Urinalysis with specific gravity greater than 1.046 otherwise unremarkable.  UDS positive for opiates and THC.  CT abdomen and pelvis done with no acute abnormalities noted to account for patient's abdominal pain.  Patient noted to have received 4 mg of IV Dilaudid in the ED and pain still not controlled noted to have received IV antiemetics for emesis and ED requesting admission for further management of acute pancreatitis, pain management.  Plan of care: The patient is accepted for admission to Med-surg  unit, at [facility]   Author: Ramiro Harvest, MD 04/20/2023  Check [email or web address] for on-call coverage.  Nursing staff, Please call [facility] Admits & Consults System-Wide number on Amion as soon as patient's arrival, so appropriate admitting provider can evaluate the pt.

## 2023-04-20 NOTE — ED Provider Notes (Signed)
[place] EMERGENCY DEPARTMENT AT [facility] Provider Note   CSN: [id number] Arrival date & time: 04/20/23  1322     History  Chief Complaint  Patient presents with   Abdominal Pain    Shane Lopez is a 42 y.o. male.  Shane Lopez is a 42 y.o. 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  He reports numerous episodes of vomiting, he denies seeing blood in his vomit or stool but reports that he was not looking for it.  Reports he has had some diarrhea.  Pain is generalized throughout the abdomen and is sharp and severe, it has been constant and nothing makes it better or worse.  He has never had pain this severe.  He denies any prior abdominal surgeries.  Does report that he has been drinking more heavily over the last 3 months drinking wine every morning, last drink yesterday.  Did try and take some ibuprofen today for his pain, denies heavy NSAID use.  Takes methadone daily and reports he went to the methadone clinic today.  Denies any other drug use.  No associated chest pain, back pain or shortness of breath.  Pain is not tearing in nature.  The history is provided by the patient and a significant other.  Abdominal Pain Associated symptoms: nausea and vomiting   Associated symptoms: no chest pain, no chills, no constipation, no cough, no diarrhea, no dysuria, no fever, no hematuria and no shortness of breath        Home Medications Prior to Admission medications   Medication Sig Start Date End Date Taking? Authorizing Provider  Multiple Vitamins-Minerals (MULTIVITAMIN PO) Take 1 tablet by mouth daily.    [provider]      Allergies    Patient has no known allergies.    Review of Systems   Review of Systems  Constitutional:  Negative for chills and fever.  HENT: Negative.     Respiratory:  Negative for cough and shortness of breath.   Cardiovascular:  Negative for chest pain.  Gastrointestinal:  Positive for abdominal pain, nausea and vomiting. Negative for constipation and diarrhea.  Genitourinary:  Negative for dysuria, flank pain and hematuria.    Physical Exam Updated Vital Signs BP 96/73 (BP Location: Right Arm)   Pulse 73   Temp 98.1 F (36.7 C) (Oral)   Resp 20   SpO2 100%  Physical Exam Vitals and nursing note reviewed.  Constitutional:      General: He is in acute distress.     Appearance: Normal appearance. He is well-developed. He is not diaphoretic.     Comments: Patient is alert, appears uncomfortable and in acute distress, writhing in pain and tearful  HENT:     Head: Normocephalic and atraumatic.  Eyes:     General:        Right eye: No discharge.        Left eye: No discharge.     Pupils: Pupils are equal, round, and reactive to light.  Cardiovascular:     Rate and Rhythm: Normal rate and regular rhythm.     Pulses: Normal pulses.     Heart sounds: Normal heart sounds.  Pulmonary:     Effort: Pulmonary effort is normal. No respiratory distress.     Breath sounds: Normal breath sounds. No wheezing or rales.  Comments: Respirations equal and unlabored, patient able to speak in full sentences, lungs clear to auscultation bilaterally  Abdominal:     General: Bowel sounds are normal. There is no distension.     Palpations: Abdomen is soft. There is no mass.     Tenderness: There is generalized abdominal tenderness. There is guarding.     Comments: Abdomen soft, nondistended, bowel sounds present throughout, there is generalized abdominal tenderness, voluntary guarding present  Musculoskeletal:        General: No deformity.     Cervical back: Neck supple.  Skin:    General: Skin is warm and dry.     Capillary Refill: Capillary refill takes less than 2 seconds.  Neurological:     Mental Status: He is alert and oriented to  person, place, and time.     Coordination: Coordination normal.     Comments: Speech is clear, able to follow commands Moves extremities without ataxia, coordination intact  Psychiatric:        Mood and Affect: Mood normal.        Behavior: Behavior normal.     ED Results / Procedures / Treatments   Labs (all labs ordered are listed, but only abnormal results are displayed) Labs Reviewed  LIPASE, BLOOD - Abnormal; Notable for the following components:      Result Value   Lipase 265 (*)    All other components within normal limits  COMPREHENSIVE METABOLIC PANEL - Abnormal; Notable for the following components:   Creatinine, Ser 1.54 (*)    Alkaline Phosphatase 35 (*)    GFR, Estimated 58 (*)    All other components within normal limits  CBC - Abnormal; Notable for the following components:   Hemoglobin 18.1 (*)    HCT 52.4 (*)    All other components within normal limits  ETHANOL  URINALYSIS, ROUTINE W REFLEX MICROSCOPIC  RAPID URINE DRUG SCREEN, HOSP PERFORMED    EKG None  Radiology No results found.  Procedures Procedures    Medications Ordered in ED Medications  ondansetron (ZOFRAN) injection 4 mg (has no administration in time range)  HYDROmorphone (DILAUDID) injection 1 mg (has no administration in time range)  HYDROmorphone (DILAUDID) injection 1 mg (1 mg Intravenous Given 04/20/23 1350)  ondansetron (ZOFRAN) injection 4 mg (4 mg Intravenous Given 04/20/23 1354)  sodium chloride 0.9 % bolus 1,000 mL (1,000 mLs Intravenous New Bag/Given 04/20/23 1401)    ED Course/ Medical Decision Making/ A&P  Click here for ABCD2, HEART and other calculatorsREFRESH Note before signing :1}                              Medical Decision Making Amount and/or Complexity of Data Reviewed Labs: ordered. Radiology: ordered.  Risk OTC drugs. Prescription drug management.   Patient presents to the ED with complaints of abdominal pain. Patient appears very uncomfortable and  in some distress, vitals WNL. On exam patient tender to palpation throughout with voluntary guarding present.  Will evaluate with labs and CT.   Ddx including but not limited to: Pancreatitis, peptic ulcer disease, perforation, obstruction, cholecystitis, choledocholithiasis, gastroenteritis, colitis, diverticulitis, appendicitis  Additional history obtained:  Additional history obtained from chart review & nursing note review.   Lab Tests:  I Ordered, viewed, and interpreted labs, which included:  CBC: No leukocytosis, mildly elevated hemoglobin, similar to baseline CMP: Creatinine mildly elevated at 1.54, no other electrolyte derangements and normal renal function Lipase: Elevated  at 265 concerning for pancreatitis Ethanol level negative.  UDS and UA pending.  Imaging Studies ordered:  I ordered imaging studies which included CT abdomen pelvis, scan completed but interpretation pending   ED Course:  Care signed out to Dr. Doran Durand pending CT read, presentation concerning for pancreatitis with mildly elevated lipase, given increased alcohol consumption question whether patient may have limited ability to mount a impressive lipase in the setting of acute pancreatitis.  After 2 mg of Dilaudid and oral GI cocktail patient's pain is currently controlled.  Anticipate patient may require admission for pain control with pancreatitis given that he is on methadone I think it would be difficult to control his pain as an outpatient.  Disposition pending CT   Portions of this note were generated with Dragon dictation software. Dictation errors may occur despite best attempts at proofreading.         Final Clinical Impression(s) / ED Diagnoses Final diagnoses:  Alcohol-induced acute pancreatitis, unspecified complication status    Rx / DC Orders ED Discharge Orders     None         Simeon Craft, PA-C 04/20/23 1618    Glyn Ade, MD 04/20/23 1727

## 2023-04-20 NOTE — ED Provider Notes (Signed)
Care of patient received from prior provider at 3:45 PM, please see their note for complete H/P and care plan.  Received handoff per ED course.  Clinical Course as of 04/20/23 1545  Thu Apr 20, 2023  1544 Chronic pancreatitis. CT for acute eval. Difficult to control pain due to methadone [CC]    Clinical Course User Index [CC] Glyn Ade, MD    Reassessment: Patient required extensive medication administration for pain control.  4X dose of IV narcotic.  Pancreatitis still not controlled on serial assessments.  Patient will require admission to medicine. Consulted medicine who agreed with need for admission patient arranged for admission without any further acute events.  Disposition:   Based on the above findings, I believe this patient is stable for admission.    Patient/family educated about specific findings on our evaluation and explained exact reasons for admission.  Patient/family educated about clinical situation and time was allowed to answer questions.   Admission team communicated with and agreed with need for admission. Patient admitted. Patient ready to move at this time.     Emergency Department Medication Summary:   Medications  ondansetron [facility]) injection 4 mg (has no administration in time range)  LORazepam (ATIVAN) tablet 1-4 mg (1 mg Oral Given 04/20/23 1525)    Or  LORazepam (ATIVAN) injection 1-4 mg ( Intravenous See Alternative 04/20/23 1525)  thiamine (VITAMIN B1) tablet 100 mg (100 mg Oral Given 04/20/23 1525)    Or  thiamine (VITAMIN B1) injection 100 mg ( Intravenous See Alternative 04/20/23 1525)  folic acid (FOLVITE) tablet 1 mg (1 mg Oral Given 04/20/23 1525)  multivitamin with minerals tablet 1 tablet (1 tablet Oral Given 04/20/23 1525)  HYDROmorphone (DILAUDID) injection 1 mg (1 mg Intravenous Given 04/20/23 1705)  lactated ringers bolus 2,000 mL (has no administration in time range)  lactated ringers infusion (has no administration in time  range)  HYDROmorphone (DILAUDID) injection 1 mg (1 mg Intravenous Given 04/20/23 1350)  ondansetron (ZOFRAN) injection 4 mg (4 mg Intravenous Given 04/20/23 1354)  sodium chloride 0.9 % bolus 1,000 mL (0 mLs Intravenous Stopped 04/20/23 1527)  HYDROmorphone (DILAUDID) injection 1 mg (1 mg Intravenous Given 04/20/23 1410)  alum & mag hydroxide-simeth (MAALOX/MYLANTA) 200-200-20 MG/5ML suspension 30 mL (30 mLs Oral Given 04/20/23 1413)  lidocaine (XYLOCAINE) 2 % viscous mouth solution 15 mL (15 mLs Mouth/Throat Given 04/20/23 1413)  iohexol (OMNIPAQUE) 300 MG/ML solution 100 mL (100 mLs Intravenous Contrast Given 04/20/23 1422)     CRITICAL CARE Performed by: Glyn Ade   Total critical care time: 45 minutes  Critical care time was exclusive of separately billable procedures and treating other patients.  Critical care was necessary to treat or prevent imminent or life-threatening deterioration.  Critical care was time spent personally by me on the following activities: development of treatment plan with patient and/or surrogate as well as nursing, discussions with consultants, evaluation of patient's response to treatment, examination of patient, obtaining history from patient or surrogate, ordering and performing treatments and interventions, ordering and review of laboratory studies, ordering and review of radiographic studies, pulse oximetry and re-evaluation of patient's condition.          Glyn Ade, MD 04/20/23 1728

## 2023-04-20 NOTE — ED Notes (Addendum)
Pt unable to provide a urine sample at this time.

## 2023-04-20 NOTE — ED Notes (Addendum)
Carelink at bedside for pt, pt and family gone. IV catheter seen at bedside. MD made aware

## 2023-04-20 NOTE — ED Notes (Addendum)
Pt called out reporting severe pain. Pt informed by this RN that dilaudid was ordered for every 3 hours and he could get another dose in about an hour. Pt also updated regarding carelink status. Pt and family verbalized understanding. MD made aware

## 2023-04-20 NOTE — ED Notes (Signed)
Pt was asked if he could provide a urine sample x3; pt stated "I told you I can't give one". Specimen cup still at bedside.

## 2023-04-20 NOTE — ED Notes (Addendum)
Attempted to obtain urine sample x2 and patient stated "not right now".

## 2023-04-20 NOTE — ED Triage Notes (Addendum)
L abd pain starting this am up waking. Emesis. No radiation, no urinary changes, last BM today.  Obvious distress.  No daily meds.

## 2023-04-24 ENCOUNTER — Other Ambulatory Visit (HOSPITAL_BASED_OUTPATIENT_CLINIC_OR_DEPARTMENT_OTHER): Payer: Self-pay
